# Patient Record
Sex: Male | Born: 1970 | Race: Black or African American | Hispanic: No | Marital: Married | Smoking: Current every day smoker
Health system: Southern US, Community
[De-identification: ages and names within clinical notes are randomized; demographics above are authoritative.]

## PROBLEM LIST (undated history)

## (undated) ENCOUNTER — Emergency Department: Payer: Self-pay | Source: Home / Self Care

## (undated) DIAGNOSIS — R51 Headache: Secondary | ICD-10-CM

## (undated) DIAGNOSIS — R519 Headache, unspecified: Secondary | ICD-10-CM

## (undated) DIAGNOSIS — R42 Dizziness and giddiness: Secondary | ICD-10-CM

## (undated) DIAGNOSIS — G8929 Other chronic pain: Secondary | ICD-10-CM

## (undated) DIAGNOSIS — I10 Essential (primary) hypertension: Secondary | ICD-10-CM

## (undated) DIAGNOSIS — G4733 Obstructive sleep apnea (adult) (pediatric): Secondary | ICD-10-CM

## (undated) DIAGNOSIS — Z8639 Personal history of other endocrine, nutritional and metabolic disease: Secondary | ICD-10-CM

## (undated) HISTORY — PX: TONSILLECTOMY: SUR1361

## (undated) HISTORY — PX: HERNIA REPAIR: SHX51

## (undated) HISTORY — DX: Obstructive sleep apnea (adult) (pediatric): G47.33

## (undated) HISTORY — DX: Headache, unspecified: R51.9

## (undated) HISTORY — DX: Headache: R51

## (undated) HISTORY — DX: Other chronic pain: G89.29

---
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
## 2006-05-17 ENCOUNTER — Other Ambulatory Visit: Payer: Self-pay

## 2006-05-17 ENCOUNTER — Emergency Department: Payer: Self-pay | Admitting: Emergency Medicine

## 2009-01-12 ENCOUNTER — Emergency Department: Payer: Self-pay | Admitting: Emergency Medicine

## 2009-01-24 ENCOUNTER — Emergency Department: Payer: Self-pay | Admitting: Emergency Medicine

## 2010-07-15 ENCOUNTER — Emergency Department: Payer: Self-pay | Admitting: Emergency Medicine

## 2011-05-30 ENCOUNTER — Emergency Department: Payer: Self-pay | Admitting: Emergency Medicine

## 2011-12-17 ENCOUNTER — Other Ambulatory Visit: Payer: Self-pay

## 2011-12-17 ENCOUNTER — Emergency Department (HOSPITAL_COMMUNITY): Payer: Self-pay

## 2011-12-17 ENCOUNTER — Emergency Department (HOSPITAL_COMMUNITY)
Admission: EM | Admit: 2011-12-17 | Discharge: 2011-12-17 | Disposition: A | Discharge status: Home Health | Payer: Self-pay | Attending: Emergency Medicine | Admitting: Emergency Medicine

## 2011-12-17 ENCOUNTER — Encounter (HOSPITAL_COMMUNITY): Payer: Self-pay | Admitting: Emergency Medicine

## 2011-12-17 DIAGNOSIS — R51 Headache: Secondary | ICD-10-CM | POA: Insufficient documentation

## 2011-12-17 DIAGNOSIS — R059 Cough, unspecified: Secondary | ICD-10-CM | POA: Insufficient documentation

## 2011-12-17 DIAGNOSIS — R079 Chest pain, unspecified: Secondary | ICD-10-CM | POA: Insufficient documentation

## 2011-12-17 DIAGNOSIS — R070 Pain in throat: Secondary | ICD-10-CM | POA: Insufficient documentation

## 2011-12-17 DIAGNOSIS — Z79899 Other long term (current) drug therapy: Secondary | ICD-10-CM | POA: Insufficient documentation

## 2011-12-17 DIAGNOSIS — J40 Bronchitis, not specified as acute or chronic: Secondary | ICD-10-CM | POA: Insufficient documentation

## 2011-12-17 DIAGNOSIS — R05 Cough: Secondary | ICD-10-CM | POA: Insufficient documentation

## 2011-12-17 DIAGNOSIS — J329 Chronic sinusitis, unspecified: Secondary | ICD-10-CM | POA: Insufficient documentation

## 2011-12-17 DIAGNOSIS — J3489 Other specified disorders of nose and nasal sinuses: Secondary | ICD-10-CM | POA: Insufficient documentation

## 2011-12-17 LAB — COMPREHENSIVE METABOLIC PANEL
ALT: 30 U/L (ref 0–53)
Alkaline Phosphatase: 75 U/L (ref 39–117)
BUN: 11 mg/dL (ref 6–23)
CO2: 29 mEq/L (ref 19–32)
Chloride: 103 mEq/L (ref 96–112)
GFR calc Af Amer: >90 mL/min (ref >90)
GFR calc non Af Amer: >90 mL/min (ref >90)
Glucose, Bld: 77 mg/dL (ref 70–99)
Potassium: 3.5 mEq/L (ref 3.5–5.1)
Sodium: 142 mEq/L (ref 135–145)
Total Bilirubin: 0.4 mg/dL (ref 0.3–1.2)

## 2011-12-17 LAB — CBC
HCT: 44.7 % (ref 39.0–52.0)
Hemoglobin: 15.3 g/dL (ref 13.0–17.0)
MCHC: 34.2 g/dL (ref 30.0–36.0)
RBC: 4.88 MIL/uL (ref 4.22–5.81)

## 2011-12-17 LAB — RAPID STREP SCREEN (MED CTR MEBANE ONLY): Streptococcus, Group A Screen (Direct): NEGATIVE

## 2011-12-17 MED ORDER — SODIUM CHLORIDE 0.9 % IV BOLUS (SEPSIS)
250.0000 mL | Freq: Once | INTRAVENOUS | Status: AC
  Administered 2011-12-17: 250 mL via INTRAVENOUS

## 2011-12-17 MED ORDER — HYDROMORPHONE HCL PF 1 MG/ML IJ SOLN
1.0000 mg | Freq: Once | INTRAMUSCULAR | Status: AC
  Administered 2011-12-17: 1 mg via INTRAVENOUS
  Filled 2011-12-17: qty 1

## 2011-12-17 MED ORDER — HYDROCODONE-ACETAMINOPHEN 5-325 MG PO TABS: 1.0000 | ORAL_TABLET | Freq: Four times a day (QID) | ORAL | Status: AC | PRN

## 2011-12-17 MED ORDER — AZITHROMYCIN 250 MG PO TABS: 250.0000 mg | ORAL_TABLET | Freq: Every day | ORAL | Status: AC

## 2011-12-17 MED ORDER — ALBUTEROL SULFATE HFA 108 (90 BASE) MCG/ACT IN AERS: 1.0000 | INHALATION_SPRAY | Freq: Four times a day (QID) | RESPIRATORY_TRACT | Status: AC | PRN

## 2011-12-17 MED ORDER — CIPROFLOXACIN HCL 500 MG PO TABS: 500.0000 mg | ORAL_TABLET | Freq: Two times a day (BID) | ORAL | Status: DC

## 2011-12-17 MED ORDER — SODIUM CHLORIDE 0.9 % IV SOLN: INTRAVENOUS | Status: DC

## 2011-12-17 NOTE — ED Notes (Signed)
Onset two weeks ago sore throat, headache and productive cough yellow white sputum. Pain headache 10/10 throbbing. Ax4.

## 2011-12-17 NOTE — Discharge Instructions (Signed)
Sinusitis Sinuses are air pockets within the bones of your face. The growth of bacteria within a sinus leads to infection. The infection prevents the sinuses from draining. This infection is called sinusitis. SYMPTOMS  There will be different areas of pain depending on which sinuses have become infected.  The maxillary sinuses often produce pain beneath the eyes.   Frontal sinusitis may cause pain in the middle of the forehead and above the eyes.  Other problems (symptoms) include:  Toothaches.   Colored, pus-like (purulent) drainage from the nose.   Swelling, warmth, and tenderness over the sinus areas may be signs of infection.  TREATMENT  Sinusitis is most often determined by an exam.X-rays may be taken. If x-rays have been taken, make sure you obtain your results or find out how you are to obtain them. Your caregiver may give you medications (antibiotics). These are medications that will help kill the bacteria causing the infection. You may also be given a medication (decongestant) that helps to reduce sinus swelling.  HOME CARE INSTRUCTIONS   Only take over-the-counter or prescription medicines for pain, discomfort, or fever as directed by your caregiver.   Drink extra fluids. Fluids help thin the mucus so your sinuses can drain more easily.   Applying either moist heat or ice packs to the sinus areas may help relieve discomfort.   Use saline nasal sprays to help moisten your sinuses. The sprays can be found at your local drugstore.  SEEK IMMEDIATE MEDICAL CARE IF:  You have a fever.   You have increasing pain, severe headaches, or toothache.   You have nausea, vomiting, or drowsiness.   You develop unusual swelling around the face or trouble seeing.  MAKE SURE YOU:   Understand these instructions.   Will watch your condition.   Will get help right away if you are not doing well or get worse.  Document Released: 09/22/2005 Document Revised: 09/11/2011 Document Reviewed:  04/21/2007 [facility] Patient Information 2012 [place], [place].  Workup with the finding of pansinusitis take antibiotic as directed resource guide provided below to help you find a primary care provider if symptoms are not improving in a few days followup here or with urgent care. Also most likely have a bronchitis use albuterol inhaler for that no evidence of pneumonia.

## 2011-12-17 NOTE — ED Provider Notes (Signed)
History     CSN: [id number]  Arrival date & time 12/17/11  1054   First MD Initiated Contact with Patient 12/17/11 1506      Chief Complaint  Patient presents with  . Sore Throat    (Consider location/radiation/quality/duration/timing/severity/associated sxs/prior treatment) The history is provided by the patient.   patient is a 41 year old male with multiple complaints today that include chest pain headache sore throat nonproductive cough congestion, correction productive cough of yellow sputum. All symptoms been present for 3 weeks headache is 10 out of 10.  Symptoms consistent with upper respiratory bronchitis-type illness new-onset headache and sore throat. No fever.  History reviewed. No pertinent past medical history.  History reviewed. No pertinent past surgical history.  No family history on file.  History  Substance Use Topics  . Smoking status: Current Everyday Smoker  . Smokeless tobacco: Not on file  . Alcohol Use: No      Review of Systems  Constitutional: Negative for fever and chills.  HENT: Positive for congestion and sore throat. Negative for neck pain.   Respiratory: Positive for cough. Negative for shortness of breath and wheezing.   Cardiovascular: Positive for chest pain.  Gastrointestinal: Negative for nausea, vomiting, abdominal pain and diarrhea.  Genitourinary: Negative for dysuria.  Musculoskeletal: Negative for back pain.  Skin: Negative for rash.  Neurological: Positive for headaches.  Hematological: Does not bruise/bleed easily.    Allergies  Darvocet; Penicillins; and Sulfa antibiotics  Home Medications   Current Outpatient Rx  Name Route Sig Dispense Refill  . CYCLOBENZAPRINE HCL 10 MG PO TABS Oral Take 10 mg by mouth once.     Marland Kitchen DEXTROMETHORPHAN-GUAIFENESIN 10-100 MG/5ML PO LIQD Oral Take 5 mLs by mouth every 4 (four) hours as needed. Cold symptoms    . IBUPROFEN 800 MG PO TABS Oral Take 800 mg by mouth every 8 (eight) hours as  needed. For pain/fever     . TRAMADOL HCL 50 MG PO TABS Oral Take 50 mg by mouth once.     . ALBUTEROL SULFATE HFA 108 (90 BASE) MCG/ACT IN AERS Inhalation Inhale 1-2 puffs into the lungs every 6 (six) hours as needed for wheezing. 1 Inhaler 0  . CIPROFLOXACIN HCL 500 MG PO TABS Oral Take 1 tablet (500 mg total) by mouth 2 (two) times daily. 20 tablet 0  . HYDROCODONE-ACETAMINOPHEN 5-325 MG PO TABS Oral Take 1-2 tablets by mouth every 6 (six) hours as needed for pain. 10 tablet 0    BP 116/63  Pulse 88  Temp(Src) 98.2 F (36.8 C) (Oral)  Resp 16  SpO2 98%  Physical Exam  Nursing note and vitals reviewed. Constitutional: He is oriented to person, place, and time. He appears well-developed and well-nourished. No distress.  HENT:  Head: Normocephalic and atraumatic.  Mouth/Throat: Oropharynx is clear and moist.       Throat is slightly erythematous no swelling no exudate  Eyes: Conjunctivae are normal. Pupils are equal, round, and reactive to light.  Neck: Normal range of motion. Neck supple.  Cardiovascular: Normal rate, regular rhythm and normal heart sounds.   No murmur heard. Pulmonary/Chest: Effort normal and breath sounds normal. No respiratory distress. He has no wheezes. He has no rales. He exhibits no tenderness.  Abdominal: Soft. Bowel sounds are normal. There is no tenderness.  Musculoskeletal: Normal range of motion. He exhibits no edema and no tenderness.  Lymphadenopathy:    He has no cervical adenopathy.  Neurological: He is alert and oriented to person,  place, and time. No cranial nerve deficit. He exhibits normal muscle tone. Coordination normal.  Skin: Skin is warm. No rash noted. No erythema.    ED Course  Procedures (including critical care time)  Labs Reviewed  COMPREHENSIVE METABOLIC PANEL - Abnormal; Notable for the following:    Total Protein 8.5 (*)    All other components within normal limits  CBC  RAPID STREP SCREEN  TROPONIN I   Dg Chest 2  View  12/17/2011  *RADIOLOGY REPORT*  Clinical Data: Sore throat.  SOB and wheezing.  CHEST - 2 VIEW  Comparison: None  Findings: The heart size appears normal.  There is no pleural effusion or pulmonary edema.  Lung volumes are low and there is accentuation of lung markings. No airspace consolidation identified.  There is central airway thickening.  Findings are compatible with lower respiratory tract viral infection or reactive airways disease.  The review of the visualized osseous structures is unremarkable.  IMPRESSION:  1.  No pneumonia. 2.  Central airway thickening compatible with lower respiratory tract viral infection or reactive airways disease.  Original Report Authenticated By: Rosealee Albee, M.D.   Ct Head Wo Contrast  12/17/2011  *RADIOLOGY REPORT*  Clinical Data: Sore throat, headache, productive cough.  CT HEAD WITHOUT CONTRAST  Technique:  Contiguous axial images were obtained from the base of the skull through the vertex without contrast.  Comparison: No priors.  Findings: No acute intracranial abnormalities.  Specifically, no focal mass, mass effect, definitive signs of acute/subacute ischemia, hydrocephalous or abnormal intra or extra-axial fluid collections.  No displaced skull fractures are noted.  Mastoids are well pneumatized bilaterally.  Visualized paranasal sinuses are remarkable for multifocal areas of mucosal thickening and air fluid levels within the maxillary sinuses bilaterally.  IMPRESSION: 1.  Findings consistent with pansinusitis. 2.  No acute intracranial abnormalities.  The appearance of the brain is normal.  Original Report Authenticated By: Florencia Reasons, M.D.    Date: 12/17/2011  Rate: 86  Rhythm: normal sinus rhythm  QRS Axis: right  Intervals: normal  ST/T Wave abnormalities: normal  Conduction Disutrbances:none  Narrative Interpretation:   Old EKG Reviewed: none available  Results for orders placed during the hospital encounter of 12/17/11  CBC       Component Value Range   WBC 8.1  4.0 - 10.5 (K/uL)   RBC 4.88  4.22 - 5.81 (MIL/uL)   Hemoglobin 15.3  13.0 - 17.0 (g/dL)   HCT 96.0  45.4 - 09.8 (%)   MCV 91.6  78.0 - 100.0 (fL)   MCH 31.4  26.0 - 34.0 (pg)   MCHC 34.2  30.0 - 36.0 (g/dL)   RDW 11.9  14.7 - 82.9 (%)   Platelets 295  150 - 400 (K/uL)  COMPREHENSIVE METABOLIC PANEL      Component Value Range   Sodium 142  135 - 145 (mEq/L)   Potassium 3.5  3.5 - 5.1 (mEq/L)   Chloride 103  96 - 112 (mEq/L)   CO2 29  19 - 32 (mEq/L)   Glucose, Bld 77  70 - 99 (mg/dL)   BUN 11  6 - 23 (mg/dL)   Creatinine, Ser 5.62  0.50 - 1.35 (mg/dL)   Calcium 9.7  8.4 - 13.0 (mg/dL)   Total Protein 8.5 (*) 6.0 - 8.3 (g/dL)   Albumin 4.0  3.5 - 5.2 (g/dL)   AST 24  0 - 37 (U/L)   ALT 30  0 - 53 (U/L)  Alkaline Phosphatase 75  39 - 117 (U/L)   Total Bilirubin 0.4  0.3 - 1.2 (mg/dL)   GFR calc non Af Amer >90  >90 (mL/min)   GFR calc Af Amer >90  >90 (mL/min)  RAPID STREP SCREEN      Component Value Range   Streptococcus, Group A Screen (Direct) NEGATIVE  NEGATIVE   TROPONIN I      Component Value Range   Troponin I <0.30  <0.30 (ng/mL)      1. Sinusitis   2. Bronchitis       MDM  Workup without finding of sinusitis pansinusitis most likely to clinically has a bronchitis no evidence of pneumonia no significant leukocytosis. Number chest x-ray negative for pneumonia head CT shows pansinusitis we'll treat with Zithromax for that. Patient given a dose of Cipro here in the emergency department perhaps not the best antibiotic but will be somewhat helpful for complaint of chest pain troponin is negative EKG without acute findings. We'll treat bronchitis with albuterol inhaler has pain medicine to taking erythromycin for the sinusitis and also potential be helpful for the bronchitis 2.        Shelda Jakes, MD 12/17/11 [id number]

## 2012-01-12 ENCOUNTER — Emergency Department: Payer: Self-pay | Admitting: Emergency Medicine

## 2012-06-09 ENCOUNTER — Ambulatory Visit: Payer: Self-pay | Admitting: Internal Medicine

## 2014-03-17 ENCOUNTER — Ambulatory Visit: Payer: Self-pay | Admitting: Internal Medicine

## 2014-03-17 DIAGNOSIS — Z0289 Encounter for other administrative examinations: Secondary | ICD-10-CM

## 2014-04-12 ENCOUNTER — Ambulatory Visit: Payer: Self-pay | Admitting: Internal Medicine

## 2014-05-04 ENCOUNTER — Emergency Department: Payer: Self-pay | Admitting: Emergency Medicine

## 2014-05-04 LAB — URINALYSIS, COMPLETE
BILIRUBIN, UR: NEGATIVE
Bacteria: NONE SEEN
Blood: NEGATIVE
GLUCOSE, UR: NEGATIVE mg/dL (ref 0–75)
Leukocyte Esterase: NEGATIVE
Nitrite: NEGATIVE
PH: 5 (ref 4.5–8.0)
PROTEIN: NEGATIVE
RBC,UR: NONE SEEN /HPF (ref 0–5)
Specific Gravity: 1.028 (ref 1.003–1.030)
Squamous Epithelial: NONE SEEN
WBC UR: NONE SEEN /HPF (ref 0–5)

## 2014-06-09 ENCOUNTER — Emergency Department (HOSPITAL_COMMUNITY): Payer: Self-pay

## 2014-06-09 ENCOUNTER — Inpatient Hospital Stay (HOSPITAL_COMMUNITY)
Admission: EM | Admit: 2014-06-09 | Discharge: 2014-06-10 | DRG: 311 | Disposition: A | Discharge status: Home / Self Care | Payer: Self-pay | Attending: Internal Medicine | Admitting: Internal Medicine

## 2014-06-09 ENCOUNTER — Encounter (HOSPITAL_COMMUNITY): Payer: Self-pay | Admitting: Emergency Medicine

## 2014-06-09 DIAGNOSIS — R1013 Epigastric pain: Secondary | ICD-10-CM

## 2014-06-09 DIAGNOSIS — F17200 Nicotine dependence, unspecified, uncomplicated: Secondary | ICD-10-CM | POA: Diagnosis present

## 2014-06-09 DIAGNOSIS — Z720 Tobacco use: Secondary | ICD-10-CM

## 2014-06-09 DIAGNOSIS — Z8249 Family history of ischemic heart disease and other diseases of the circulatory system: Secondary | ICD-10-CM

## 2014-06-09 DIAGNOSIS — R0789 Other chest pain: Secondary | ICD-10-CM

## 2014-06-09 DIAGNOSIS — I200 Unstable angina: Principal | ICD-10-CM

## 2014-06-09 DIAGNOSIS — R079 Chest pain, unspecified: Secondary | ICD-10-CM

## 2014-06-09 LAB — CBC
HEMATOCRIT: 40.9 % (ref 39.0–52.0)
HEMOGLOBIN: 14.1 g/dL (ref 13.0–17.0)
MCH: 31.3 pg (ref 26.0–34.0)
MCHC: 34.5 g/dL (ref 30.0–36.0)
MCV: 90.9 fL (ref 78.0–100.0)
Platelets: 224 10*3/uL (ref 150–400)
RBC: 4.5 MIL/uL (ref 4.22–5.81)
RDW: 12.5 % (ref 11.5–15.5)
WBC: 4 10*3/uL (ref 4.0–10.5)

## 2014-06-09 LAB — D-DIMER, QUANTITATIVE (NOT AT ARMC)

## 2014-06-09 LAB — HEPATIC FUNCTION PANEL
ALBUMIN: 3.6 g/dL (ref 3.5–5.2)
ALK PHOS: 61 U/L (ref 39–117)
ALT: 31 U/L (ref 0–53)
AST: 29 U/L (ref 0–37)
Bilirubin, Direct: <0.2 mg/dL (ref 0.0–0.3)
TOTAL PROTEIN: 6.7 g/dL (ref 6.0–8.3)
Total Bilirubin: 0.4 mg/dL (ref 0.3–1.2)

## 2014-06-09 LAB — BASIC METABOLIC PANEL
Anion gap: 11 (ref 5–15)
BUN: 11 mg/dL (ref 6–23)
CHLORIDE: 107 meq/L (ref 96–112)
CO2: 26 mEq/L (ref 19–32)
Calcium: 8.6 mg/dL (ref 8.4–10.5)
Creatinine, Ser: 1.01 mg/dL (ref 0.50–1.35)
GFR calc non Af Amer: >90 mL/min (ref >90)
GLUCOSE: 95 mg/dL (ref 70–99)
POTASSIUM: 3.6 meq/L — AB (ref 3.7–5.3)
Sodium: 144 mEq/L (ref 137–147)

## 2014-06-09 LAB — I-STAT TROPONIN, ED: TROPONIN I, POC: 0 ng/mL (ref 0.00–0.08)

## 2014-06-09 LAB — HEMOGLOBIN A1C
Hgb A1c MFr Bld: 5.5 % (ref <5.7)
MEAN PLASMA GLUCOSE: 111 mg/dL (ref <117)

## 2014-06-09 LAB — TROPONIN I
Troponin I: <0.3 ng/mL (ref <0.30)
Troponin I: <0.3 ng/mL (ref <0.30)

## 2014-06-09 LAB — MAGNESIUM: Magnesium: 2.1 mg/dL (ref 1.5–2.5)

## 2014-06-09 LAB — HEPARIN LEVEL (UNFRACTIONATED): Heparin Unfractionated: <0.1 IU/mL — ABNORMAL LOW (ref 0.30–0.70)

## 2014-06-09 MED ORDER — ACETAMINOPHEN 325 MG PO TABS
650.0000 mg | ORAL_TABLET | Freq: Once | ORAL | Status: AC
  Administered 2014-06-09: 650 mg via ORAL
  Filled 2014-06-09: qty 2

## 2014-06-09 MED ORDER — NITROGLYCERIN 0.4 MG SL SUBL: 0.4000 mg | SUBLINGUAL_TABLET | SUBLINGUAL | Status: DC | PRN

## 2014-06-09 MED ORDER — MORPHINE SULFATE 2 MG/ML IJ SOLN
1.0000 mg | INTRAMUSCULAR | Status: DC | PRN
  Administered 2014-06-10: 1 mg via INTRAVENOUS
  Filled 2014-06-09: qty 1

## 2014-06-09 MED ORDER — POTASSIUM CHLORIDE CRYS ER 20 MEQ PO TBCR
20.0000 meq | EXTENDED_RELEASE_TABLET | Freq: Once | ORAL | Status: AC
  Administered 2014-06-09: 20 meq via ORAL
  Filled 2014-06-09: qty 1

## 2014-06-09 MED ORDER — NITROGLYCERIN 2 % TD OINT
1.0000 [in_us] | TOPICAL_OINTMENT | Freq: Four times a day (QID) | TRANSDERMAL | Status: DC
  Administered 2014-06-09: 1 [in_us] via TOPICAL
  Filled 2014-06-09 (×2): qty 1

## 2014-06-09 MED ORDER — HEPARIN BOLUS VIA INFUSION
4000.0000 [IU] | Freq: Once | INTRAVENOUS | Status: AC
  Administered 2014-06-09: 4000 [IU] via INTRAVENOUS
  Filled 2014-06-09: qty 4000

## 2014-06-09 MED ORDER — HEPARIN (PORCINE) IN NACL 100-0.45 UNIT/ML-% IJ SOLN
1300.0000 [IU]/h | INTRAMUSCULAR | Status: DC
  Administered 2014-06-09: 1300 [IU]/h via INTRAVENOUS
  Filled 2014-06-09: qty 250

## 2014-06-09 MED ORDER — KETOROLAC TROMETHAMINE 30 MG/ML IJ SOLN: 30.0000 mg | Freq: Once | INTRAMUSCULAR | Status: DC

## 2014-06-09 MED ORDER — ENOXAPARIN SODIUM 40 MG/0.4ML ~~LOC~~ SOLN
40.0000 mg | SUBCUTANEOUS | Status: DC
  Filled 2014-06-09 (×2): qty 0.4

## 2014-06-09 NOTE — ED Notes (Signed)
Pt c/o severe h/a that just started. Repositioned, explained NTG actions to pt.

## 2014-06-09 NOTE — Consult Note (Signed)
Cardiology consultation  Referring MD:  Dr. Rhunette Croft    PATIENT PROFILE: Roy Jackson is a 43 y.o. male presented to the ER today with complaint of midsternal chest pressure associated with diaphoresis, and dyspnea.   HPI:  Roy Jackson is a 43 y.o. male who denies any previous cardiac history.  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  He does a fair amount of lifting.  Today, he experience midsternal chest tightness and pressure associated with shortness of breath, diaphoresis, and also, which would seem to intensify with deep breathing.  He states the pain lasted in total approximately 5 hours.  He presented to [facility] emergency room for evaluation.  ECG did not show acute ST segment changes, but does most show a small nondiagnostic Q wave in 3, and S wave in lead 1.  A d-dimer has been sent and this is negative.  Previously, the patient denies any exertional component of any chest pain.  He denies exertional shortness of breath.  Cardiac risk factors is notable for very rare history of smoking.  He does not use recreational drugs.  There is a family history for heart disease in his grandfather and 2 of his uncles who had suffered myocardial infarctions.  History reviewed. No pertinent past medical history.  Past Surgical History  Procedure Laterality Date  . Tonsillectomy    . Hernia repair      Allergies  Allergen Reactions  . Darvocet [Propoxyphene N-Acetaminophen] Other (See Comments)    unknown  . Penicillins Swelling  . Sulfa Antibiotics Other (See Comments)    unkown    Current Facility-Administered Medications  Medication Dose Route Frequency Provider Last Rate Last Dose  . heparin ADULT infusion 100 units/mL (25000 units/250 mL)  1,300 Units/hr Intravenous Continuous Ankit Nanavati, MD 13 mL/hr at 06/09/14 1358 1,300 Units/hr at 06/09/14 1358  . nitroGLYCERIN (NITROGLYN) 2 % ointment 1 inch  1 inch Topical 4 times per day Derwood Kaplan, MD   1  inch at 06/09/14 1452   Current Outpatient Prescriptions  Medication Sig Dispense Refill  . Multiple Vitamin (MULTIVITAMIN WITH MINERALS) TABS tablet Take 1 tablet by mouth daily.       Socially, he is married and has one child.  Family history is notable in that his father died when the patient was 28 months old from a car accident.  His mother is alive at 74 years.  He has 3 sisters. ROS General: Negative; No fevers, chills, or night sweats HEENT: Negative; No changes in vision or hearing, sinus congestion, difficulty swallowing Pulmonary: Negative; No cough, wheezing, shortness of breath, hemoptysis Cardiovascular:  See HPI;  GI: Negative; No nausea, vomiting, diarrhea, or abdominal pain GU: Negative; No dysuria, hematuria, or difficulty voiding Musculoskeletal: Negative; no myalgias, joint pain, or weakness Hematologic/Oncologic: Negative; no easy bruising, bleeding Endocrine: Negative; no heat/cold intolerance; no diabetes Neuro: Negative; no changes in balance, headaches Skin: Negative; No rashes or skin lesions Psychiatric: Negative; No behavioral problems, depression Sleep: Negative; No daytime sleepiness, hypersomnolence, bruxism, restless legs, hypnogagnic hallucinations Other comprehensive 14 point system review is negative   Physical Exam BP 124/71  Pulse 69  Temp(Src) 98.1 F (36.7 C) (Oral)  Resp 16  Ht  (1.702 m)  Wt 230 lb (104.327 kg)  BMI 36.01 kg/m2  SpO2 96% General: Alert, oriented, no distress.  Muscular gentleman. Skin: normal turgor, no rashes, warm and dry HEENT: Normocephalic, atraumatic. Pupils equal round and reactive to light; sclera  anicteric; extraocular muscles intact;  Nose without nasal septal hypertrophy Mouth/Parynx benign; Mallinpatti scale 3 Neck: No JVD, no carotid bruits; normal carotid upstroke Lungs: clear to ausculatation and percussion; no wheezing or rales Chest wall: Definite tenderness to palpation.  The patient would  grimace when pressure was applied to his anterior chest. Heart: PMI not displaced, RRR, s1 s2 normal, 1/6 systolic murmur, no diastolic murmur, no rubs, gallops, thrills, or heaves Abdomen: soft, nontender; no hepatosplenomehaly, BS+; abdominal aorta nontender and not dilated by palpation. Back: no CVA tenderness Pulses 2+ Musculoskeletal: full range of motion, normal strength, no joint deformities Extremities: no clubbing cyanosis or edema, Homan's sign negative  Neurologic: grossly nonfocal; Cranial nerves grossly wnl Psychologic: Normal mood and affect   ECG (independently read by me): Normal sinus rhythm at 67.  Small nondiagnostic Q wave in 3, S wave in lead 1.  No significant ST changes.  LABS:  BMET    Component Value Date/Time   NA 144 06/09/2014 1132   K 3.6* 06/09/2014 1132   CL 107 06/09/2014 1132   CO2 26 06/09/2014 1132   GLUCOSE 95 06/09/2014 1132   BUN 11 06/09/2014 1132   CREATININE 1.01 06/09/2014 1132   CALCIUM 8.6 06/09/2014 1132   GFRNONAA >90 06/09/2014 1132   GFRAA >90 06/09/2014 1132     Hepatic Function Panel     Component Value Date/Time   PROT 8.5* 12/17/2011 1605   ALBUMIN 4.0 12/17/2011 1605   AST 24 12/17/2011 1605   ALT 30 12/17/2011 1605   ALKPHOS 75 12/17/2011 1605   BILITOT 0.4 12/17/2011 1605     CBC    Component Value Date/Time   WBC 4.0 06/09/2014 1132   RBC 4.50 06/09/2014 1132   HGB 14.1 06/09/2014 1132   HCT 40.9 06/09/2014 1132   PLT 224 06/09/2014 1132   MCV 90.9 06/09/2014 1132   MCH 31.3 06/09/2014 1132   MCHC 34.5 06/09/2014 1132   RDW 12.5 06/09/2014 1132   Troponin (Point of Care Test)  Recent Labs  06/09/14 1157  TROPIPOC 0.00    BNP No results found for this basename: probnp    Lipid Panel  No results found for this basename: chol, trig, hdl, cholhdl, vldl, ldlcalc      RADIOLOGY: Dg Chest 2 View  06/09/2014   CLINICAL DATA:  Chest pain  EXAM: CHEST  2 VIEW  COMPARISON:  Chest radiograph 12/17/2011  FINDINGS: Stable cardiac and  mediastinal contours. No consolidative pulmonary opacities. No pleural effusion or pneumothorax. Regional skeleton is unremarkable.  IMPRESSION: No acute cardiopulmonary process.   Electronically Signed   By: Annia Belt M.D.   On: 06/09/2014 12:43     ASSESSMENT AND PLAN:  1.  Chest pain: The patient has significant tenderness to palpation over the anterior chest wall, suggesting a musculoskeletal etiology to his chest discomfort.  He has been started on nitroglycerin paste, as well as IV heparin in the emergency room.  His ECG does not show acute ST segment changes.  He does have some pleuritic component to his discomfort.  His d-dimer is negative arguing against potential pulmonary embolism.  Doubt ischemic chest pain, but the patient did have chest pain, lasting approximately 5 hours with significant intensity.  Initial troponin is 0.  Agree with keeping patient overnight, at least for observation and serial cardiac enzymes and f/u ECG. May be worthwhile to consider IV Toradol to see if this improves his chest wall discomfort for which he is exquisitely  tender.  Noninvasive cardiac screening with 2-D echo Doppler and nuclear stress testing can be performed for cardiac evaluation and his laboratory workup is negative, this can be done as an outpatient.                                                                                                                                                                                                                                                                                                                    Lennette Bihari, MD, [facility] 06/09/2014 4:04 PM

## 2014-06-09 NOTE — ED Notes (Signed)
Cardiology consult at bedside.

## 2014-06-09 NOTE — ED Provider Notes (Addendum)
CSN: [id number]     Arrival date & time 06/09/14  1111 History   First MD Initiated Contact with Patient 06/09/14 1137     Chief Complaint  Patient presents with  . Chest Pain     (Consider location/radiation/quality/duration/timing/severity/associated sxs/prior Treatment) HPI Comments: Pt comes in with cc of chest pain while at work. Pain started mdisternal area, radiating to the left side and left arm. Pt describes the pain as squeezing pain, still feels sore in that area. + nausea, sweats and had some dib.  PT has no hx of similar pain. No medical problems, occasional smoker, and has family hx of CAD (66s and 40s - uncle and grand father).   Patient is a 43 y.o. male presenting with chest pain. The history is provided by the patient.  Chest Pain Associated symptoms: diaphoresis, nausea, numbness and shortness of breath   Associated symptoms: no abdominal pain and no cough     No past medical history on file. No past surgical history on file. No family history on file. History  Substance Use Topics  . Smoking status: Current Every Day Smoker  . Smokeless tobacco: Not on file  . Alcohol Use: No    Review of Systems  Constitutional: Positive for diaphoresis. Negative for activity change and appetite change.  Respiratory: Positive for shortness of breath. Negative for cough.   Cardiovascular: Positive for chest pain.  Gastrointestinal: Positive for nausea. Negative for abdominal pain.  Genitourinary: Negative for dysuria.  Neurological: Positive for numbness.      Allergies  Darvocet; Penicillins; and Sulfa antibiotics  Home Medications   Prior to Admission medications   Medication Sig Start Date End Date Taking? Authorizing Provider  Multiple Vitamin (MULTIVITAMIN WITH MINERALS) TABS tablet Take 1 tablet by mouth daily.   Yes Historical Provider, MD   BP 123/67  Pulse 48  Temp(Src) 97.9 F (36.6 C) (Oral)  Resp 14  Ht  (1.702 m)  Wt 230 lb (104.327 kg)  BMI  36.01 kg/m2  SpO2 99% Physical Exam  Nursing note and vitals reviewed. Constitutional: He is oriented to person, place, and time. He appears well-developed.  HENT:  Head: Normocephalic and atraumatic.  Eyes: Conjunctivae and EOM are normal. Pupils are equal, round, and reactive to light.  Neck: Normal range of motion. Neck supple.  Cardiovascular: Normal rate, regular rhythm and intact distal pulses.   Pulmonary/Chest: Effort normal and breath sounds normal. He exhibits tenderness.  Abdominal: Soft. Bowel sounds are normal. He exhibits no distension. There is no tenderness. There is no rebound and no guarding.  Neurological: He is alert and oriented to person, place, and time.  Skin: Skin is warm.    ED Course  Procedures (including critical care time) Labs Review Labs Reviewed  BASIC METABOLIC PANEL - Abnormal; Notable for the following:    Potassium 3.6 (*)    All other components within normal limits  CBC  D-DIMER, QUANTITATIVE  HEPARIN LEVEL (UNFRACTIONATED)  TROPONIN I  I-STAT TROPOININ, ED    Imaging Review Dg Chest 2 View  06/09/2014   CLINICAL DATA:  Chest pain  EXAM: CHEST  2 VIEW  COMPARISON:  Chest radiograph 12/17/2011  FINDINGS: Stable cardiac and mediastinal contours. No consolidative pulmonary opacities. No pleural effusion or pneumothorax. Regional skeleton is unremarkable.  IMPRESSION: No acute cardiopulmonary process.   Electronically Signed   By: Annia Belt M.D.   On: 06/09/2014 12:43     EKG Interpretation   Date/Time:  Friday June 09 2014 11:18:42 EDT Ventricular Rate:  67 PR Interval:  129 QRS Duration: 86 QT Interval:  428 QTC Calculation: 452 R Axis:   95 Text Interpretation:  Sinus rhythm Borderline right axis deviation t wave  inversion in v1 is new Confirmed by Rhunette Croft, MD, Janey Genta [phone]) on  06/09/2014 12:39:46 PM      MDM   Final diagnoses:  Chest pain of uncertain etiology  Unstable angina    Pt with chest pain, midsternal,  radiating to the left side, including arm. + diaphoresis, nausea, dib. Pt 's sx have responded to nitro - went from 9/10 to 1/10, pain still present slightly. Pt has no CAD risk factors. EKG shows no acute ST changes. Pt does have s1q3t3 pattern, so we will get a screening dimer. Working dx currently is acs vs, PE. ASA full dose taken at work, prior to ER arrival.  Pt has no risk factors for PE, or for dissection. WELLS score is 0, PERC neg.  Derwood Kaplan, MD 06/09/14 1305  2:51 PM Dimer is neg. CP free, Will admit as unassigned. Spoke with PA-C from Cards, pt ok to be admitted to hospitalist for nstemi, with them on consult.  Derwood Kaplan, MD 06/09/14 1451

## 2014-06-09 NOTE — ED Notes (Signed)
To ED via [facility] with c/o chest pain while at work-- hurts to move, hurts to breath-- pain on palpation-- received NTG x 2, ASA  per EMS with some relief of pain--

## 2014-06-09 NOTE — H&P (Signed)
Date: 06/09/2014               Patient Name:  Roy Jackson MRN: [id number]  DOB: 10-02-1971 Age / Sex: 43 y.o., male   PCP: Lorre Munroe, NP         Medical Service: Internal Medicine Teaching Service         Attending Physician: Dr. Derwood Kaplan, MD    First Contact: Dr. Isabella Jackson Pager: [phone]  Second Contact: Dr. Shirlee Latch Pager: [phone]       After Hours (After 5p/  First Contact Pager: [phone]  weekends / holidays): Second Contact Pager: [phone]   Chief Complaint: chest pain  History of Present Illness: Mr. Meiner is a 43 year old man with no significant past medical history presenting with chest pain x 1 day. He was standing around at work when he suddenly became diaphoretic. He had squeezing pain located in the mid sternal region and radiates to the left upper arm. The pain lasted for 5-6 hours and was 8-9/10 in severity at worst. He notes no exacerbating factors. It was relieved by nitroglycerin he received in the ED. He has never had pain like this in the past. Presently he feels chest soreness that is 1-2/10 in severity. He had associated lightheadedness, shortness of breath, nausea, and nonbloody vomiting x3-4. He denies fevers, chills, cough, palpitations, exertional SOB, exertional chest pain, reflux, abdominal pain, diarrhea, dysuria, hematuria, legg swelling. He smokes half a pack a week of cigarettes x 1 year. He has a maternal grandfather and two maternal uncles that had MI in their 4s-50s. 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  Meds: Current Facility-Administered Medications  Medication Dose Route Frequency Provider Last Rate Last Dose  . heparin ADULT infusion 100 units/mL (25000 units/250 mL)  1,300 Units/hr Intravenous Continuous Ankit Nanavati, MD 13 mL/hr at 06/09/14 1358 1,300 Units/hr at 06/09/14 1358  . nitroGLYCERIN (NITROGLYN) 2 % ointment 1 inch  1 inch Topical 4 times per day Derwood Kaplan, MD   1 inch  at 06/09/14 1452   Current Outpatient Prescriptions  Medication Sig Dispense Refill  . Multiple Vitamin (MULTIVITAMIN WITH MINERALS) TABS tablet Take 1 tablet by mouth daily.        Allergies: Allergies as of 06/09/2014 - Review Complete 06/09/2014  Allergen Reaction Noted  . Darvocet [propoxyphene n-acetaminophen] Other (See Comments) 12/17/2011  . Penicillins Swelling 12/17/2011  . Sulfa antibiotics Other (See Comments) 12/17/2011   History reviewed. No pertinent past medical history. Past Surgical History  Procedure Laterality Date  . Tonsillectomy    . Hernia repair    (R inguinal 10-12 years ago)  Family History Maternal Uncle x2 with CAD, MI in 30s Maternal Grandfather with CAD, MI in 81s Maternal Grandmother with HTN  History   Social History  . Marital Status: Married    Spouse Name: N/A    Number of Children: N/A  . Years of Education: N/A   Occupational History  . Not on file.   Social History Main Topics  . Smoking status: Current Some Day Smoker  . Smokeless tobacco: Not on file  . Alcohol Use: No  . Drug Use: No  . Sexual Activity: Not on file   Other Topics Concern  . Not on file   Social History Narrative  . 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  Review of Systems: Constitutional: no fevers/chills Eyes: no vision changes Ears, nose, mouth, throat, and face: no cough Respiratory: +shortness of breath Cardiovascular: +chest pain Gastrointestinal: +nausea/vomiting, no abdominal pain, no constipation, no diarrhea Genitourinary: no dysuria, no hematuria Integument: no rash Hematologic/lymphatic: no bleeding/bruising, no edema Musculoskeletal: no arthralgias, no myalgias Neurological: no paresthesias, no weakness  Physical Exam: Blood pressure 124/71, pulse 70, temperature 98.1 F (36.7 C), temperature source Oral, resp.  rate 21, height  (1.702 m), weight 230 lb (104.327 kg), SpO2 95.00%.  General Apperance: NAD Head: Normocephalic, atraumatic Eyes: PERRL, EOMI, anicteric sclera Ears: Normal external ear canal Nose: Nares normal, septum midline, mucosa normal Throat: Lips, mucosa and tongue normal  Neck: Supple, trachea midline Back: No tenderness or bony abnormality  Lungs: Clear to auscultation bilaterally. No wheezes, rhonchi or rales. Breathing comfortably Chest Wall: Nontender, no deformity Heart: Regular rate and rhythm, no murmur/rub/gallop Abdomen: Soft, nontender, nondistended, no rebound/guarding Extremities: Normal, atraumatic, warm and well perfused, no edema Pulses: 2+ throughout Skin: No rashes or lesions Neurologic: Alert and oriented x 3. CNII-XII intact. Normal strength and sensation  Lab results: Basic Metabolic Panel:  Recent Labs  16/10/96 1132  NA 144  K 3.6*  CL 107  CO2 26  GLUCOSE 95  BUN 11  CREATININE 1.01  CALCIUM 8.6   Liver Function Tests:  Recent Labs  06/09/14 1132  AST 29  ALT 31  ALKPHOS 61  BILITOT 0.4  PROT 6.7  ALBUMIN 3.6   CBC:  Recent Labs  06/09/14 1132  WBC 4.0  HGB 14.1  HCT 40.9  MCV 90.9  PLT 224   Cardiac Enzymes:  Recent Labs  06/09/14 1443  TROPONINI <0.30   D-Dimer:  Recent Labs  06/09/14 1132  DDIMER <0.27   Urine Drug Screen: Drugs of Abuse  No results found for this basename: labopia,  cocainscrnur,  labbenz,  amphetmu,  thcu,  labbarb   Imaging results:  Dg Chest 2 View  06/09/2014   CLINICAL DATA:  Chest pain  EXAM: CHEST  2 VIEW  COMPARISON:  Chest radiograph 12/17/2011  FINDINGS: Stable cardiac and mediastinal contours. No consolidative pulmonary opacities. No pleural effusion or pneumothorax. Regional skeleton is unremarkable.  IMPRESSION: No acute cardiopulmonary process.   Electronically Signed   By: Annia Belt M.D.   On: 06/09/2014 12:43    Other results: EKG: Normal sinus rhythm. T wave in  lead III present in previous EKG. T wave inverted in V1 new compared to previous EKG. Otherwise EKG unchanged from previous.  Assessment & Plan by Problem: Active Problems:   Unstable angina  Chest pain: differential includes acute coronary syndrome, PE, infectious aortic dissection, pneumothorax, GERD, MSK. Pain is not reproducible on palpation making MSK etiology less likely. Denies reflux symptoms making GERD less likely. CXR with no acute cardiopulmonary process making pneumothorax or aortic dissection less likely. Wells score 0 and Geneva score 3 (heart rate 75-94) making him low risk for PE. He had S1Q3T3 pattern on his EKG but d-dimer is negative. Initial troponin negative and EKG without acute ischemic changes. His TIMI score is 0 presently. Cardiology consulted by ED. -UDS pending -repeat EKG in AM -trend troponins -lipid panel -hemoglobin A1c  -morphine  Q2hr prn pain -nitrostat SL 0.4mg  Q61min prn chest pain -telemetry -per cardiology, patient can be evaluated as outpatient with 2D echo and nuclear stress test  FEN/GI: heart healthy  DVT ppx: lovenox  daily, SCDs  Dispo:  Disposition is deferred at this time, awaiting improvement of current medical problems. Anticipated discharge in approximately 1 day(s).   The patient does not have a current PCP Lorre Munroe, NP) and does not need an [facility] hospital follow-up appointment after discharge.  The patient does not know have transportation limitations that hinder transportation to clinic appointments.  Signed: Griffin Basil, MD 06/09/2014, 3:34 PM

## 2014-06-09 NOTE — Progress Notes (Signed)
ANTICOAGULATION CONSULT NOTE - Initial Consult  Pharmacy Consult for heparin Indication: chest pain/ACS  Allergies  Allergen Reactions  . Darvocet [Propoxyphene N-Acetaminophen] Other (See Comments)    unknown  . Penicillins Swelling  . Sulfa Antibiotics Other (See Comments)    unkown    Patient Measurements:    Dosing Weight:  83.5 kg  Vital Signs: Temp: 97.9 F (36.6 C) (09/04 1122) Temp src: Oral (09/04 1122) BP: 117/70 mmHg (09/04 1235) Pulse Rate: 58 (09/04 1235)  Labs:  Recent Labs  06/09/14 1132  HGB 14.1  HCT 40.9  PLT 224    CrCl is unknown because there is no height on file for the current visit.   Medical History: No past medical history on file.  Medications:  See medication history  Assessment: 43 yo man c/o chest pain while at work.  He has a family history of CAD.  His troponin is negative, baseline CBC wnl.  CXR unremarkable. Goal of Therapy:  Heparin level 0.3-0.7 units/ml Monitor platelets by anticoagulation protocol: Yes   Plan:  Heparin bolus 4000 units and drip at 1300 units/hr Check heparin level 6 hours after start. Daily HL and CBC while on heparin  Thanks for allowing pharmacy to be a part of this patient's care.  Talbert Cage, PharmD Clinical Pharmacist, [phone] 06/09/2014,12:47 PM

## 2014-06-10 DIAGNOSIS — Z720 Tobacco use: Secondary | ICD-10-CM | POA: Diagnosis present

## 2014-06-10 LAB — RAPID URINE DRUG SCREEN, HOSP PERFORMED
Amphetamines: NOT DETECTED
BENZODIAZEPINES: NOT DETECTED
Barbiturates: NOT DETECTED
COCAINE: NOT DETECTED
Opiates: POSITIVE — AB
Tetrahydrocannabinol: NOT DETECTED

## 2014-06-10 LAB — LIPID PANEL
Cholesterol: 166 mg/dL (ref 0–200)
HDL: 43 mg/dL (ref >39)
LDL CALC: 105 mg/dL — AB (ref 0–99)
Total CHOL/HDL Ratio: 3.9 RATIO
Triglycerides: 90 mg/dL (ref <150)
VLDL: 18 mg/dL (ref 0–40)

## 2014-06-10 LAB — CBC
HEMATOCRIT: 39.4 % (ref 39.0–52.0)
HEMOGLOBIN: 13.4 g/dL (ref 13.0–17.0)
MCH: 32.1 pg (ref 26.0–34.0)
MCHC: 34 g/dL (ref 30.0–36.0)
MCV: 94.5 fL (ref 78.0–100.0)
Platelets: 196 10*3/uL (ref 150–400)
RBC: 4.17 MIL/uL — AB (ref 4.22–5.81)
RDW: 12.9 % (ref 11.5–15.5)
WBC: 4.1 10*3/uL (ref 4.0–10.5)

## 2014-06-10 LAB — LIPASE, BLOOD: Lipase: 32 U/L (ref 11–59)

## 2014-06-10 LAB — OCCULT BLOOD X 1 CARD TO LAB, STOOL: Fecal Occult Bld: NEGATIVE

## 2014-06-10 LAB — TROPONIN I: Troponin I: <0.3 ng/mL (ref <0.30)

## 2014-06-10 MED ORDER — PANTOPRAZOLE SODIUM 40 MG PO TBEC: 40.0000 mg | DELAYED_RELEASE_TABLET | Freq: Every day | ORAL | Status: DC

## 2014-06-10 MED ORDER — PANTOPRAZOLE SODIUM 40 MG PO TBEC
40.0000 mg | DELAYED_RELEASE_TABLET | Freq: Every day | ORAL | Status: DC
  Administered 2014-06-10: 40 mg via ORAL
  Filled 2014-06-10: qty 1

## 2014-06-10 MED ORDER — ENOXAPARIN SODIUM 40 MG/0.4ML ~~LOC~~ SOLN
40.0000 mg | SUBCUTANEOUS | Status: DC
  Administered 2014-06-10: 40 mg via SUBCUTANEOUS
  Filled 2014-06-10: qty 0.4

## 2014-06-10 NOTE — Progress Notes (Signed)
Subjective: No acute events overnight. He is doing well. He reports some headache. He denies chest pain, shortness of breath, nausea, vomiting, abdominal pain. He reports he has had heartburn and acid reflux in the past that is relieved by Tums.  Objective: Vital signs in last 24 hours: Filed Vitals:   06/09/14 1641 06/09/14 1730 06/09/14 2155 06/10/14 0456  BP:  129/71 116/55 113/70  Pulse:  64 63 64  Temp: 98.2 F (36.8 C) 98.9 F (37.2 C) 98 F (36.7 C) 98.7 F (37.1 C)  TempSrc: Oral Oral Oral Oral  Resp:  Height:   (1.702 m)    Weight:  217 lb 13 oz (98.8 kg)  217 lb 9.5 oz (98.7 kg)  SpO2:  100% 99% 100%   Weight change:  No intake or output data in the 24 hours ending 06/10/14 0810 General Apperance: NAD  Head: Normocephalic, atraumatic  Eyes: PERRL, EOMI, anicteric sclera  Ears: Normal external ear canal  Nose: Nares normal, septum midline, mucosa normal  Throat: Lips, mucosa and tongue normal  Neck: Supple, trachea midline  Back: No tenderness or bony abnormality  Lungs: Clear to auscultation bilaterally. No wheezes, rhonchi or rales. Breathing comfortably  Chest Wall: Nontender, no deformity  Heart: Regular rate and rhythm, no murmur/rub/gallop  Abdomen: Soft, nontender, nondistended, no rebound/guarding  Extremities: Normal, atraumatic, warm and well perfused, no edema  Pulses: 2+ throughout  Skin: No rashes or lesions  Neurologic: Alert and oriented x 3. CNII-XII intact. Normal strength and sensation  Lab Results: Basic Metabolic Panel:  Recent Labs Lab 06/09/14 1132  NA 144  K 3.6*  CL 107  CO2 26  GLUCOSE 95  BUN 11  CREATININE 1.01  CALCIUM 8.6  MG 2.1   Liver Function Tests:  Recent Labs Lab 06/09/14 1132  AST 29  ALT 31  ALKPHOS 61  BILITOT 0.4  PROT 6.7  ALBUMIN 3.6   Lipase     Component Value Date/Time   LIPASE 32 06/10/2014 0343   CBC:  Recent Labs Lab 06/09/14 1132 06/10/14 0343  WBC 4.0 4.1  HGB  14.1 13.4  HCT 40.9 39.4  MCV 90.9 94.5  PLT 224 196   Cardiac Enzymes:  Recent Labs Lab 06/09/14 1443 06/09/14 2017 06/10/14 0343  TROPONINI <0.30 <0.30 <0.30   D-Dimer:  Recent Labs Lab 06/09/14 1132  DDIMER <0.27   Hemoglobin A1C:  Recent Labs Lab 06/09/14 1132  HGBA1C 5.5   Fasting Lipid Panel:  Recent Labs Lab 06/10/14 0343  CHOL 166  HDL 43  LDLCALC 105*  TRIG 90  CHOLHDL 3.9   Studies/Results: Dg Chest 2 View  06/09/2014   CLINICAL DATA:  Chest pain  EXAM: CHEST  2 VIEW  COMPARISON:  Chest radiograph 12/17/2011  FINDINGS: Stable cardiac and mediastinal contours. No consolidative pulmonary opacities. No pleural effusion or pneumothorax. Regional skeleton is unremarkable.  IMPRESSION: No acute cardiopulmonary process.   Electronically Signed   By: Annia Belt M.D.   On: 06/09/2014 12:43   Medications: I have reviewed the patient's current medications. Scheduled Meds: . enoxaparin (LOVENOX) injection  40 mg Subcutaneous Q24H  . ketorolac  30 mg Intravenous Once   Continuous Infusions:  PRN Meds:.morphine injection, nitroGLYCERIN Assessment/Plan: Principal Problem:   Chest pain Active Problems:   Unstable angina  Chest pain: CXR with no acute cardiopulmonary process making pneumothorax or aortic dissection less likely. S1Q3T3 pattern on his initial EKG but d-dimer is negative. EKG without  acute ischemic changes. Troponin negative x 3. Lipase wnl. Lipid panel with normal total cholesterol, triglycerides, and HDL. Hemoglobin A1c 5.5. Cardiology consulted by ED.  -UDS pending  -FOBT -H. Pylori antibody -start PPI  daily -repeat EKG pending -morphine  Q2hr prn pain  -nitrostat SL 0.4mg  Q37min prn chest pain  -telemetry   -per cardiology, patient can be evaluated as outpatient with 2D echo and nuclear stress test   FEN/GI: heart healthy   DVT ppx: lovenox  daily, SCDs  Dispo: likely home today  The patient does not have a current PCP  Lorre Munroe, NP) and does not need an [facility] hospital follow-up appointment after discharge.  The patient does not know have transportation limitations that hinder transportation to clinic appointments.  .Services Needed at time of discharge: Y = Yes, Blank = No PT:   OT:   RN:   Equipment:   Other:     LOS: 1 day   Griffin Basil, MD 06/10/2014, 8:10 AM

## 2014-06-10 NOTE — H&P (Signed)
INTERNAL MEDICINE TEACHING ATTENDING NOTE  Day 1 of stay  Patient name: Roy Jackson  MRN: [id number] Date of birth: [birth year]   Key clinical points and exam                                                          43 y.o.old male african american, no significant past medical history and on no medications, admitted with sudden onset chest pain (epigastric/substernal) with nausea, vomiting, diaphoresis and radiation to left upper arm, which lasted 5-6 hours and was relieved by nitroglycerin. The patient reports 1-2 weeks of heart burn, and sometimes waking up with sour metallic taste in his mouth. He does not take over the counter pain medications regularly. He smokes occassionally ("1 pack lasts a week"). He denies any nausea after eating. He has never had such pain in the past. He denies any blood in stool, or any change in the color of his stool. He denies any abdominal pain, rash or new medication intake. He works at a [employer] with another person. He has not had any nausea or vomiting since admission, and just feels sore right now. He reports a headache.   On exam, his vitals are stable. He is alert and oriented. He does have some substernal tenderness to palpation, and some epigastric and LUQ tenderness to pressure. His oropharynx is moist, midline tongue with no overt pathology. He has no cervical lymphadenopathy. His heart has regular rate and rhythm with no murmurs. He has no pedal edema.   He has a son 79 years old, and a daughter 69 months old.  Assessment and Plan                                                                      Chest/Epigastric pain - I agree with the plan outlined by Dr Isabella Bowens and Dr Shirlee Latch with the following additions:  - Add lipase to work up. - Add H Pylori antibody testing. - Acute MI work up negative and has been seen by cardiology.  - Liver enzymes not elevated, no RUQ tenderness thus doubt cholecystitis or  choledocholithiasis.  - Gradual decline in HgB over the years. Do an FOBT.  - Start on PPI now and discharge on it.  - Set up a follow up in [facility] clinic if the patient agreeable where he might be able to get an orange card.  - Get a GI referral set up for a possible work up if symptoms don't resolve post discharge.  I have seen and evaluated this patient and discussed it with my IM resident team.  Please see the rest of the plan per resident note from today.   Alayja Armas 06/10/2014, 8:49 AM.

## 2014-06-10 NOTE — Discharge Instructions (Signed)
Chest Pain (Nonspecific) °It is often hard to give a specific diagnosis for the cause of chest pain. There is always a chance that your pain could be related to something serious, such as a heart attack or a blood clot in the lungs. You need to follow up with your health care provider for further evaluation. °CAUSES  °· Heartburn. °· Pneumonia or bronchitis. °· Anxiety or stress. °· Inflammation around your heart (pericarditis) or lung (pleuritis or pleurisy). °· A blood clot in the lung. °· A collapsed lung (pneumothorax). It can develop suddenly on its own (spontaneous pneumothorax) or from trauma to the chest. °· Shingles infection (herpes zoster virus). °The chest wall is composed of bones, muscles, and cartilage. Any of these can be the source of the pain. °· The bones can be bruised by injury. °· The muscles or cartilage can be strained by coughing or overwork. °· The cartilage can be affected by inflammation and become sore (costochondritis). °DIAGNOSIS  °Lab tests or other studies may be needed to find the cause of your pain. Your health care provider may have you take a test called an ambulatory electrocardiogram (ECG). An ECG records your heartbeat patterns over a 24-hour period. You may also have other tests, such as: °· Transthoracic echocardiogram (TTE). During echocardiography, sound waves are used to evaluate how blood flows through your heart. °· Transesophageal echocardiogram (TEE). °· Cardiac monitoring. This allows your health care provider to monitor your heart rate and rhythm in real time. °· Holter monitor. This is a portable device that records your heartbeat and can help diagnose heart arrhythmias. It allows your health care provider to track your heart activity for several days, if needed. °· Stress tests by exercise or by giving medicine that makes the heart beat faster. °TREATMENT  °· Treatment depends on what may be causing your chest pain. Treatment may include: °¨ Acid blockers for  heartburn. °¨ Anti-inflammatory medicine. °¨ Pain medicine for inflammatory conditions. °¨ Antibiotics if an infection is present. °· You may be advised to change lifestyle habits. This includes stopping smoking and avoiding alcohol, caffeine, and chocolate. °· You may be advised to keep your head raised (elevated) when sleeping. This reduces the chance of acid going backward from your stomach into your esophagus. °Most of the time, nonspecific chest pain will improve within 2-3 days with rest and mild pain medicine.  °HOME CARE INSTRUCTIONS  °· If antibiotics were prescribed, take them as directed. Finish them even if you start to feel better. °· For the next few days, avoid physical activities that bring on chest pain. Continue physical activities as directed. °· Do not use any tobacco products, including cigarettes, chewing tobacco, or electronic cigarettes. °· Avoid drinking alcohol. °· Only take medicine as directed by your health care provider. °· Follow your health care provider's suggestions for further testing if your chest pain does not go away. °· Keep any follow-up appointments you made. If you do not go to an appointment, you could develop lasting (chronic) problems with pain. If there is any problem keeping an appointment, call to reschedule. °SEEK MEDICAL CARE IF:  °· Your chest pain does not go away, even after treatment. °· You have a rash with blisters on your chest. °· You have a fever. °SEEK IMMEDIATE MEDICAL CARE IF:  °· You have increased chest pain or pain that spreads to your arm, neck, jaw, back, or abdomen. °· You have shortness of breath. °· You have an increasing cough, or you cough   up blood. °· You have severe back or abdominal pain. °· You feel nauseous or vomit. °· You have severe weakness. °· You faint. °· You have chills. °This is an emergency. Do not wait to see if the pain will go away. Get medical help at once. Call your local emergency services (911 in [place] Do not drive  yourself to the hospital. °MAKE SURE YOU:  °· Understand these instructions. °· Will watch your condition. °· Will get help right away if you are not doing well or get worse. °Document Released: 07/02/2005 Document Revised: 09/27/2013 Document Reviewed: 04/27/2008 °ExitCare® Patient Information ©2015 [employer]. This information is not intended to replace advice given to you by your health care provider. Make sure you discuss any questions you have with your health care provider. ° ° °Emergency Department Resource Guide °1) Find a Doctor and Pay Out of Pocket °Although you won't have to find out who is covered by your insurance plan, it is a good idea to ask around and get recommendations. You will then need to call the office and see if the doctor you have chosen will accept you as a new patient and what types of options they offer for patients who are self-pay. Some doctors offer discounts or will set up payment plans for their patients who do not have insurance, but you will need to ask so you aren't surprised when you get to your appointment. ° °2) Contact Your Local Health Department °Not all health departments have doctors that can see patients for sick visits, but many do, so it is worth a call to see if yours does. If you don't know where your local health department is, you can check in your phone book. The CDC also has a tool to help you locate your state's health department, and many state websites also have listings of all of their local health departments. ° °3) Find a Walk-in Clinic °If your illness is not likely to be very severe or complicated, you may want to try a walk in clinic. These are popping up all over the country in pharmacies, drugstores, and shopping centers. They're usually staffed by nurse practitioners or physician assistants that have been trained to treat common illnesses and complaints. They're usually fairly quick and inexpensive. However, if you have serious medical issues or  chronic medical problems, these are probably not your best option. ° °No Primary Care Doctor: °- Call [facility] at  832-8000 - they can help you locate a primary care doctor that  accepts your insurance, provides certain services, etc. °- Physician Referral Service- 1-800-533-3463 ° °Chronic Pain Problems: °Organization         Address  Phone   Notes  °[facility]  (336) 297-2271 Patients need to be referred by their primary care doctor.  ° °Medication Assistance: °Organization         Address  Phone   Notes  °[place] [facility] [address] °[place], [place] [address] (336) 641-8030 --Must be a resident of [place] °-- Must have NO insurance coverage whatsoever (no Medicaid/ Medicare, etc.) °-- The pt. MUST have a primary care doctor that directs their care regularly and follows them in the community °  °[facility]  (866) 331-1348   °[employer]  (888) 892-1162   ° °Agencies that provide inexpensive medical care: °Organization         Address  Phone   Notes  °[facility]  (  336) 832-8035   °[facility]    (336) 832-7272   °[facility] [address] °[place], [place] [address] (336) 832-4777   °[facility] [address], °[place] (336) 271-4999   °[facility]    (336) 373-0678   °[facility]    (336) 272-1050   °[facility] ° [address], [place] Phone:  (336) 832-4444, Fax:  (336) 832-4440 Hours of Operation:  9 am - 6 pm, M-F.  Also accepts Medicaid/Medicare and self-pay.  °[facility] ° [address], [place] Phone: (336) [phone], Fax: (336) [phone]. Hours of Operation:  8:30 am - 5:30 pm, M-F.  Also accepts Medicaid and self-pay.  °[facility] [place] [address], [place] Phone: (336) 878-6027   °[facility] [address], [place], [place]  (336)723-1848, Ext. 123 Mondays & Thursdays: 7-9 AM.  First 15 patients are seen on a first come, first serve basis. °  ° °Medicaid-accepting [place] Providers: ° °Organization         Address  Phone   Notes  °[facility] [address], [place] (336) 641-2100 Also accepts self-pay patients.  °[facility] [address], [place] ° (336) 856-9996   °[facility] [address], [place] (336) 288-8857   °[facility] [address] [place] [address], [place] (336) 299-7000   °Veita Bland [address], [place]  ° (336) 373-1557 Only accepts [place] Access Medicaid patients after they have their name applied to their card.  ° °Self-Pay (no insurance) in [place]: ° °Organization         Address  Phone   Notes  °Sickle Cell Patients, [facility] [address], [place] (336) 832-1970   °[facility] [address], [place] (336) 832-4400   ° [facility] ° [address], [place] (336) 992-4800   °[facility]/Dr. Osei-Bonsu ° [address] [place] [address], [place] or [address], [place] (336) [phone] Phone number for both [place] and [place] locations is the same.  °[facility] [address], [place] (336) 299-0000   °[facility] [address] [place] [address], [place] or [address] (336) 852-7530 °(336) 878-2260   °[facility] [address], [place] (336) 350-1642, phone; (336) 294-5005, fax Sees patients 1st and 3rd Saturday of every month.  Must not qualify for public or private insurance (i.e. Medicaid, Medicare, [place] Health Choice, Veterans' Benefits) • Household income should be no more than 200% of the poverty level •The clinic cannot treat you if you are pregnant or think you are pregnant • Sexually transmitted  diseases are not treated at the clinic.  ° ° °Dental Care: °Organization         Address  Phone  Notes  °[facility] [address],  (336) 641-6152 Accepts children up to age 21 who are enrolled in Medicaid or [place] Health Choice; pregnant women with a Medicaid card; and children who have applied for Medicaid or [place] Health Choice, but were declined, whose parents can pay a reduced fee at time of service.  °[facility] [place]    [address], [place] (336) 641-7733 Accepts children up to age 21 who are enrolled in Medicaid or [place] Health Choice; pregnant women with a Medicaid card; and children who have applied for Medicaid or [place] Health Choice, but were declined, whose parents can pay a reduced fee at time of service.  °[facility] ° [address], [place] (336) 641-4533 Patients are seen by appointment only. Walk-ins are not accepted. [facility] will see patients 18 years of age and older. °Monday - Tuesday (8am-5pm) °Most Wednesdays (8:30-5pm) °$30 per visit, cash only  °[facility] ° [address], [place] (336) 641-4533 Patients are seen by appointment only. Walk-ins are not accepted. [facility] will see patients 18 years of age and older. °One Wednesday Evening (Monthly: Volunteer Based).  $30 per visit, cash only  °[facility]  (919) 537-3737 for adults; Children under age 4, call Graduate Pediatric Dentistry at ([phone]) [phone]. Children aged 4-14, please call ([phone]) [phone] to request a pediatric application. ° Dental services are provided in all areas of dental care including fillings, crowns and bridges, complete and partial dentures, implants, gum treatment, root canals, and extractions. Preventive care is also provided. Treatment is provided to both adults and children. °Patients are selected via a  lottery and there is often a waiting list. °  °[facility] [address], °[place] ° (336) 763-8833 [email or web address] °  °[facility] [address], [place], [place] (336)723-1848, Ext. 123 Second and Fourth Thursday of each month, opens at 6:30 AM; Clinic ends at 9 AM.  Patients are seen on a first-come first-served basis, and a limited number are seen during each clinic.  ° °[facility] ° [address], [place], [place] (336) 723-7904   Eligibility Requirements °You must have lived in [place], [place], or [place] counties for at least the last three months. °  You cannot be eligible for state or federal sponsored healthcare insurance, including Veterans Administration, Medicaid, or Medicare. °  You generally cannot be eligible for healthcare insurance through your employer.  °  How to apply: °Eligibility screenings are held every Tuesday and Wednesday afternoon from 1:00 pm until 4:00 pm. You do not need an appointment for the interview!  °[facility] [address], [place], [place] 336-631-2330   °[facility]  336-342-8273   °[facility]  336-703-3100   °[facility]  336-570-6415   ° °Behavioral Health Resources in the Community: °Intensive Outpatient Programs °Organization         Address  Phone  Notes  °[facility] [address], [place], [place] 336-878-6098   °[facility] [address], [place], [place] 336-832-9800   °[facility] [address], [place], [place] ° 336-882-2125   °[facility] [address],  °[place],  1-800-853-5163 or 336-641-4981   °Substance Abuse Resources °Organization         Address  Phone  Notes  °[facility]  336-882-2125   °[facility]  336-784-9470   °[facility]  336-285-9073   °[facility]  336-845-3988   °Residential &  Outpatient Substance Abuse Program  1-800-659-3381   °Psychological Services °Organization         Address  Phone  Notes  °[facility]  336- 832-9600   °  [facility]  336- 378-7881   °[facility] [address], [place] 1-800-853-5163 or 336-641-4981   ° °Mobile Crisis Teams °Organization         Address  Phone  Notes  °[facility], Mobile Crisis Care Unit  1-877-626-1772   °Assertive °[facility] ° [address] [place], [place] 336-834-9664   °Sharon DeEsch [address] °[place] 336-554-5454   ° °Self-Help/Support Groups °Organization         Address  Phone             Notes  °[facility] - variety of support groups  336- 373-1402 Call for more information  °Narcotics Anonymous (NA), [facility] [address], °[place]  2 meetings at this location  ° °Residential Treatment Programs °Organization         Address  Phone  Notes  °[facility] [address],    °[place]  1-866-801-8205   °[facility] ° [address], [place], [place] 704-293-8524   °[facility] [address], [place] 336-845-3988 Admissions: 8am-3pm M-F  °[facility] [address]    °[place], [place] 336-841-1104   °[facility] [address], [place], [place] 336-379-7146   °[facility] [address]  °[place], [place] 336-285-9073   °[facility] - Intensive Outpatient [address], [place], [place] 336-852-3033   °[facility] [address]  °[place], [place] 1-877-615-2722 or 336-784-9470   °[facility] [address] [place], [place] 336-227-7417 Accepts Medicaid  °[facility] [address]  ° [place] 1-800-659-3381 Substance Abuse/Addiction Treatment  ° °[place] Behavioral Health Resources °Organization          Address  Phone  Notes  °[facility]  (888) 581-9988   °Julie Brannon, PhD [address] [place], [place]   (336) 349-5553 or (336) 951-0000   °[facility]   [address] °[place], [place] (336) 349-4454   °[facility] [address], [place], [place] (336) 342-8316 Insurance/Medicaid/sponsorship through [facility]  °[facility] [address]                                    [place], [place] (336) 342-8316 Therapy/tele-psych/case  °[facility] [address]  ° [place],  (336) 349-2233    °Dr. Arfeen  (336) 349-4544   °[facility] [place]  [employer] [place] [facility] 1) [address], [place] °2) [address], [place] °3)  [address], [place] (336) 349-3220 °(336) 342-7768 ° °(336) 342-8140   °[place] [facility] (336) 342-1394 or (336) 342-3537 (After Hours)    ° ° ° °

## 2014-06-10 NOTE — Discharge Summary (Signed)
Name: Roy Jackson MRN: [id number] DOB: [birth year] 43 y.o. PCP: Lorre Munroe, NP  Date of Admission: 06/09/2014 11:11 AM Date of Discharge: 06/10/2014 Attending Physician: Aletta Edouard, MD  Discharge Diagnosis: Principal Problem:   Chest pain Active Problems:   Unstable angina  Discharge Medications:   Medication List         multivitamin with minerals Tabs tablet  Take 1 tablet by mouth daily.        Disposition and follow-up:   Roy Jackson was discharged from [facility] in Stable condition.  At the hospital follow up visit please address:  1. Chest pain. Please make sure patient follows up with cardiology to receive 2D echo and stress test. Evaluate if PPI is helping with his symptoms.  2.  Labs / imaging needed at time of follow-up: none  3.  Pending labs/ test needing follow-up: H Pylori  Follow-up Appointments:     Follow-up Information   Follow up with PCP. Schedule an appointment as soon as possible for a visit in 2 weeks.   Contact information:   See below for resources for finding a PCP      Schedule an appointment as soon as possible for a visit with KELLY,THOMAS A, MD. (Please call to set up an appointment for further cardiac evaluation including a 2D echocardiogram and a stress test.)    Specialty:  Cardiology   Contact information:   [address] [place] [address] [phone]       Discharge Instructions: Discharge Instructions   Call MD for:  difficulty breathing, headache or visual disturbances    Complete by:  As directed      Call MD for:  persistant dizziness or light-headedness    Complete by:  As directed      Call MD for:  persistant nausea and vomiting    Complete by:  As directed      Call MD for:  severe uncontrolled pain    Complete by:  As directed      Call MD for:  temperature >100.4    Complete by:  As directed      Diet - low sodium heart healthy    Complete by:  As  directed      Increase activity slowly    Complete by:  As directed            Consultations: Cardiology  Procedures Performed:  Dg Chest 2 View  06/09/2014   CLINICAL DATA:  Chest pain  EXAM: CHEST  2 VIEW  COMPARISON:  Chest radiograph 12/17/2011  FINDINGS: Stable cardiac and mediastinal contours. No consolidative pulmonary opacities. No pleural effusion or pneumothorax. Regional skeleton is unremarkable.  IMPRESSION: No acute cardiopulmonary process.   Electronically Signed   By: Annia Belt M.D.   On: 06/09/2014 12:43    Admission HPI: Roy Jackson is a 43 year old man with no significant past medical history presenting with chest pain x 1 day. He was standing around at work when he suddenly became diaphoretic. He had squeezing pain located in the mid sternal region and radiates to the left upper arm. The pain lasted for 5-6 hours and was 8-9/10 in severity at worst. He notes no exacerbating factors. It was relieved by nitroglycerin he received in the ED. He has never had pain like this in the past. Presently he feels chest soreness that is 1-2/10 in severity. He had associated lightheadedness, shortness of breath, nausea, and nonbloody vomiting  x3-4. He denies fevers, chills, cough, palpitations, exertional SOB, exertional chest pain, reflux, abdominal pain, diarrhea, dysuria, hematuria, legg swelling. He smokes half a pack a week of cigarettes x 1 year. He has a maternal grandfather and two maternal uncles that had MI in their 27s-50s. 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  Hospital Course by problem list: Principal Problem:   Chest pain Active Problems:   Unstable angina   1. Chest pain: differential includes acute coronary syndrome, PE, infectious aortic dissection, pneumothorax, GERD, MSK. Pain is not reproducible on palpation making MSK etiology less likely. Denies reflux symptoms making GERD less likely. CXR with no acute  cardiopulmonary process making pneumothorax or aortic dissection less likely. Wells score 0 and Geneva score 3 (heart rate 75-94) making him low risk for PE. He had S1Q3T3 pattern on his EKG but d-dimer is negative. Initial troponin negative and EKG without acute ischemic changes. His TIMI score is 0 presently. Cardiology consulted by ED. Troponin negative x 3. Lipase wnl. Lipid panel with normal total cholesterol, triglycerides, and HDL. Hemoglobin A1c 5.5.  UDS positive for opiates. FOBT negative. Repeat EKG unchanged. He was started on pantoprazole  daily. At discharge he had no recurrence of his chest pain. Per cardiology, patient can be evaluated as outpatient with 2D echo and nuclear stress test.   Discharge Vitals:   BP 113/70  Pulse 64  Temp(Src) 98.7 F (37.1 C) (Oral)  Resp 18  Ht  (1.702 m)  Wt 217 lb 9.5 oz (98.7 kg)  BMI 34.07 kg/m2  SpO2 100%  General Apperance: NAD  Head: Normocephalic, atraumatic  Eyes: PERRL, EOMI, anicteric sclera  Ears: Normal external ear canal  Nose: Nares normal, septum midline, mucosa normal  Throat: Lips, mucosa and tongue normal  Neck: Supple, trachea midline  Back: No tenderness or bony abnormality  Lungs: Clear to auscultation bilaterally. No wheezes, rhonchi or rales. Breathing comfortably  Chest Wall: Nontender, no deformity  Heart: Regular rate and rhythm, no murmur/rub/gallop  Abdomen: Soft, nontender, nondistended, no rebound/guarding  Extremities: Normal, atraumatic, warm and well perfused, no edema  Pulses: 2+ throughout  Skin: No rashes or lesions  Neurologic: Alert and oriented x 3. CNII-XII intact. Normal strength and sensation   Discharge Labs:  Results for orders placed during the hospital encounter of 06/09/14 (from the past 24 hour(s))  CBC     Status: None   Collection Time    06/09/14 11:32 AM      Result Value Ref Range   WBC 4.0  4.0 - 10.5 K/uL   RBC 4.50  4.22 - 5.81 MIL/uL   Hemoglobin 14.1  13.0 - 17.0  g/dL   HCT 16.1  09.6 - 04.5 %   MCV 90.9  78.0 - 100.0 fL   MCH 31.3  26.0 - 34.0 pg   MCHC 34.5  30.0 - 36.0 g/dL   RDW 40.9  81.1 - 91.4 %   Platelets 224  150 - 400 K/uL  BASIC METABOLIC PANEL     Status: Abnormal   Collection Time    06/09/14 11:32 AM      Result Value Ref Range   Sodium 144  137 - 147 mEq/L   Potassium 3.6 (*) 3.7 - 5.3 mEq/L   Chloride 107  96 - 112 mEq/L   CO2 26  19 - 32 mEq/L   Glucose, Bld 95  70 - 99 mg/dL   BUN 11  6 - 23 mg/dL   Creatinine, Ser 1.61  0.50 - 1.35 mg/dL   Calcium 8.6  8.4 - 09.6 mg/dL   GFR calc non Af Amer >90  >90 mL/min   GFR calc Af Amer >90  >90 mL/min   Anion gap 11  5 - 15  D-DIMER, QUANTITATIVE     Status: None   Collection Time    06/09/14 11:32 AM      Result Value Ref Range   D-Dimer, Quant <0.27  0.00 - 0.48 ug/mL-FEU  MAGNESIUM     Status: None   Collection Time    06/09/14 11:32 AM      Result Value Ref Range   Magnesium 2.1  1.5 - 2.5 mg/dL  HEPATIC FUNCTION PANEL     Status: None   Collection Time    06/09/14 11:32 AM      Result Value Ref Range   Total Protein 6.7  6.0 - 8.3 g/dL   Albumin 3.6  3.5 - 5.2 g/dL   AST 29  0 - 37 U/L   ALT 31  0 - 53 U/L   Alkaline Phosphatase 61  39 - 117 U/L   Total Bilirubin 0.4  0.3 - 1.2 mg/dL   Bilirubin, Direct <0.4  0.0 - 0.3 mg/dL   Indirect Bilirubin NOT CALCULATED  0.3 - 0.9 mg/dL  HEMOGLOBIN V4U     Status: None   Collection Time    06/09/14 11:32 AM      Result Value Ref Range   Hemoglobin A1C 5.5  <5.7 %   Mean Plasma Glucose 111  <117 mg/dL  I-STAT TROPOININ, ED     Status: None   Collection Time    06/09/14 11:57 AM      Result Value Ref Range   Troponin i, poc 0.00  0.00 - 0.08 ng/mL   Comment 3           TROPONIN I     Status: None   Collection Time    06/09/14  2:43 PM      Result Value Ref Range   Troponin I <0.30  <0.30 ng/mL  HEPARIN LEVEL (UNFRACTIONATED)     Status: Abnormal   Collection Time    06/09/14  6:48 PM      Result Value Ref  Range   Heparin Unfractionated <0.10 (*) 0.30 - 0.70 IU/mL  TROPONIN I     Status: None   Collection Time    06/09/14  8:17 PM      Result Value Ref Range   Troponin I <0.30  <0.30 ng/mL  TROPONIN I     Status: None   Collection Time    06/10/14  3:43 AM      Result Value Ref Range   Troponin I <0.30  <0.30 ng/mL  CBC     Status: Abnormal   Collection Time    06/10/14  3:43 AM      Result Value Ref Range   WBC 4.1  4.0 - 10.5 K/uL   RBC 4.17 (*) 4.22 - 5.81 MIL/uL   Hemoglobin 13.4  13.0 - 17.0 g/dL   HCT 98.1  19.1 - 47.8 %   MCV 94.5  78.0 - 100.0 fL   MCH 32.1  26.0 - 34.0 pg   MCHC 34.0  30.0 - 36.0 g/dL   RDW 29.5  62.1 - 30.8 %   Platelets 196  150 - 400 K/uL  LIPID PANEL  Status: Abnormal   Collection Time    06/10/14  3:43 AM      Result Value Ref Range   Cholesterol 166  0 - 200 mg/dL   Triglycerides 90  <132 mg/dL   HDL 43  >44 mg/dL   Total CHOL/HDL Ratio 3.9     VLDL 18  0 - 40 mg/dL   LDL Cholesterol 010 (*) 0 - 99 mg/dL  LIPASE, BLOOD     Status: None   Collection Time    06/10/14  3:43 AM      Result Value Ref Range   Lipase 32  11 - 59 U/L    Signed: Griffin Basil, MD 06/10/2014, 10:11 AM    Services Ordered on Discharge: None Equipment Ordered on Discharge: None

## 2014-06-14 LAB — HELICOBACTER PYLORI ABS-IGG+IGA, BLD
H Pylori IgA: 1 U/mL (ref <9.0)
H Pylori IgG: 0.43 {ISR}

## 2014-06-16 NOTE — Discharge Planning (Signed)
[facility] Liaison was not able to see the patient, [facility] orange card information and resources will be sent to the address provided.

## 2014-09-15 ENCOUNTER — Emergency Department: Payer: Self-pay | Admitting: Emergency Medicine

## 2014-09-15 LAB — CBC WITH DIFFERENTIAL/PLATELET
BASOS ABS: 0 10*3/uL (ref 0.0–0.1)
Basophil %: 0.8 %
Eosinophil #: 0 10*3/uL (ref 0.0–0.7)
Eosinophil %: 0.6 %
HCT: 45.7 % (ref 40.0–52.0)
HGB: 15.3 g/dL (ref 13.0–18.0)
LYMPHS PCT: 25.7 %
Lymphocyte #: 1.5 10*3/uL (ref 1.0–3.6)
MCH: 31.7 pg (ref 26.0–34.0)
MCHC: 33.4 g/dL (ref 32.0–36.0)
MCV: 95 fL (ref 80–100)
Monocyte #: 0.4 x10 3/mm (ref 0.2–1.0)
Monocyte %: 6.2 %
NEUTROS ABS: 3.8 10*3/uL (ref 1.4–6.5)
NEUTROS PCT: 66.7 %
Platelet: 253 10*3/uL (ref 150–440)
RBC: 4.81 10*6/uL (ref 4.40–5.90)
RDW: 13 % (ref 11.5–14.5)
WBC: 5.7 10*3/uL (ref 3.8–10.6)

## 2014-09-15 LAB — COMPREHENSIVE METABOLIC PANEL
ALBUMIN: 3.8 g/dL (ref 3.4–5.0)
ALK PHOS: 75 U/L
Anion Gap: 5 — ABNORMAL LOW (ref 7–16)
BILIRUBIN TOTAL: 0.3 mg/dL (ref 0.2–1.0)
BUN: 14 mg/dL (ref 7–18)
CHLORIDE: 110 mmol/L — AB (ref 98–107)
CO2: 28 mmol/L (ref 21–32)
CREATININE: 1.15 mg/dL (ref 0.60–1.30)
Calcium, Total: 8.5 mg/dL (ref 8.5–10.1)
EGFR (African American): >60
EGFR (Non-African Amer.): >60
GLUCOSE: 74 mg/dL (ref 65–99)
OSMOLALITY: 284 (ref 275–301)
POTASSIUM: 3.4 mmol/L — AB (ref 3.5–5.1)
SGOT(AST): 22 U/L (ref 15–37)
SGPT (ALT): 30 U/L
Sodium: 143 mmol/L (ref 136–145)
TOTAL PROTEIN: 7.8 g/dL (ref 6.4–8.2)

## 2014-09-15 LAB — TROPONIN I: Troponin-I: <0.02 ng/mL

## 2015-06-21 ENCOUNTER — Emergency Department (HOSPITAL_COMMUNITY)
Admission: EM | Admit: 2015-06-21 | Discharge: 2015-06-21 | Discharge status: Against Medical Advice | Payer: Self-pay | Attending: Emergency Medicine | Admitting: Emergency Medicine

## 2015-06-21 ENCOUNTER — Encounter (HOSPITAL_COMMUNITY): Payer: Self-pay | Admitting: Emergency Medicine

## 2015-06-21 DIAGNOSIS — R109 Unspecified abdominal pain: Secondary | ICD-10-CM | POA: Insufficient documentation

## 2015-06-21 DIAGNOSIS — Z720 Tobacco use: Secondary | ICD-10-CM | POA: Insufficient documentation

## 2015-06-21 NOTE — ED Notes (Signed)
Pt came to front stated that his pain was worsening. Shanda Bumps RN was notified in triage. I informed pt that we were working on getting him a room. Pt stated he was going to go to [facility] because they had no wait. Pt left.

## 2015-06-21 NOTE — ED Notes (Signed)
Pt sts increased pain in know right sided hernia after moving this week

## 2016-09-23 ENCOUNTER — Emergency Department (HOSPITAL_COMMUNITY)
Admission: EM | Admit: 2016-09-23 | Discharge: 2016-09-23 | Disposition: A | Discharge status: Home / Self Care | Payer: Managed Care, Other (non HMO) | Attending: Emergency Medicine | Admitting: Emergency Medicine

## 2016-09-23 ENCOUNTER — Encounter (HOSPITAL_COMMUNITY): Payer: Self-pay | Admitting: Emergency Medicine

## 2016-09-23 DIAGNOSIS — K047 Periapical abscess without sinus: Secondary | ICD-10-CM | POA: Diagnosis not present

## 2016-09-23 DIAGNOSIS — F17200 Nicotine dependence, unspecified, uncomplicated: Secondary | ICD-10-CM | POA: Diagnosis not present

## 2016-09-23 DIAGNOSIS — K0889 Other specified disorders of teeth and supporting structures: Secondary | ICD-10-CM | POA: Diagnosis present

## 2016-09-23 MED ORDER — CLINDAMYCIN PHOSPHATE 600 MG/50ML IV SOLN
600.0000 mg | Freq: Once | INTRAVENOUS | Status: AC
  Administered 2016-09-23: 600 mg via INTRAVENOUS
  Filled 2016-09-23: qty 50

## 2016-09-23 MED ORDER — CLINDAMYCIN HCL 300 MG PO CAPS: 300.0000 mg | ORAL_CAPSULE | Freq: Three times a day (TID) | ORAL | 0 refills | Status: DC

## 2016-09-23 MED ORDER — NAPROXEN 500 MG PO TABS: 500.0000 mg | ORAL_TABLET | Freq: Two times a day (BID) | ORAL | 0 refills | Status: DC

## 2016-09-23 MED ORDER — HYDROCODONE-ACETAMINOPHEN 5-325 MG PO TABS
1.0000 | ORAL_TABLET | Freq: Once | ORAL | Status: AC
  Administered 2016-09-23: 1 via ORAL
  Filled 2016-09-23: qty 1

## 2016-09-23 NOTE — ED Notes (Signed)
ED Provider at bedside. 

## 2016-09-23 NOTE — ED Triage Notes (Signed)
Pt sts left upper dental pain x 2 days  

## 2016-09-23 NOTE — ED Provider Notes (Signed)
[facility]-EMERGENCY DEPT Provider Note   CSN: [id number] Arrival date & time: 09/23/16  1812 By signing my name below, I, Bridgette HabermannMaria Tan, attest that this documentation has been prepared under the direction and in the presence of [facility] Kristol Almanzar, [place]. Electronically Signed: Bridgette HabermannMaria Tan, ED Scribe. 09/23/16. 6:37 PM.  History   Chief Complaint Chief Complaint  Patient presents with  . Dental Pain   HPI Comments: Roy Jackson is a 45 y.o. male with no pertinent PMHx, who presents to the Emergency Department complaining of left upper dental pain onset 2 days ago with associated left ear pain and left-sided facial swelling. Pt reports purulent-like drainage from the tooth area. He has been taking Ibuprofen with no relief. Pt notes he has no dentist at this time but has tried to call numerous dentists who state they cannot get him an appointment until next year. Pt denies fever, chills, nausea, vomiting, or any other associated symptoms.   The history is provided by the patient. No language interpreter was used.    History reviewed. No pertinent past medical history.  Patient Active Problem List   Diagnosis Date Noted  . Tobacco abuse 06/10/2014  . Unstable angina (HCC) 06/09/2014  . Chest pain 06/09/2014    Past Surgical History:  Procedure Laterality Date  . HERNIA REPAIR    . TONSILLECTOMY         Home Medications    Prior to Admission medications   Medication Sig Start Date End Date Taking? Authorizing Provider  clindamycin (CLEOCIN) 300 MG capsule Take 1 capsule (300 mg total) by mouth 3 (three) times daily. 09/23/16   Marylynn Rigdon Orlene OchM Saranne Crislip, NP  Multiple Vitamin (MULTIVITAMIN WITH MINERALS) TABS tablet Take 1 tablet by mouth daily.    Historical Provider, MD  naproxen (NAPROSYN) 500 MG tablet Take 1 tablet (500 mg total) by mouth 2 (two) times daily. 09/23/16   Onofre Gains Orlene OchM Tymeshia Awan, NP  pantoprazole (PROTONIX) 40 MG tablet Take 1 tablet (40 mg total) by mouth daily. 06/10/14   Lora PaulaJennifer T Krall, MD     Family History Family History  Problem Relation Age of Onset  . Heart attack      maternal GF died in 6250s, maternal uncles x 2 in 140s     Social History Social History  Substance Use Topics  . Smoking status: Current Some Day Smoker  . Smokeless tobacco: Not on file  . Alcohol use No     Allergies   Darvocet [propoxyphene n-acetaminophen]; Penicillins; and Sulfa antibiotics   Review of Systems Review of Systems  Constitutional: Negative for chills and fever.  HENT: Positive for dental problem and facial swelling.   Gastrointestinal: Negative for nausea and vomiting.  All other systems reviewed and are negative.    Physical Exam Updated Vital Signs BP 146/87 (BP Location: Right Arm)   Pulse 64   Temp 98.5 F (36.9 C) (Oral)   Resp 18   Ht 5\' 9"  (1.753 m)   Wt 101.6 kg   SpO2 100%   BMI 33.08 kg/m   Physical Exam  Constitutional: He is oriented to person, place, and time. He appears well-developed and well-nourished. No distress.  Appears uncomfortable  HENT:  Head: Atraumatic.  Mouth/Throat: Uvula is midline and oropharynx is clear and moist. Abnormal dentition. Dental caries present.    Tooth on the left upper tooth that is broken, decayed. Abscess to the gum above the tooth. Facial swelling on the left.  Eyes: Conjunctivae are normal.  Neck: Neck supple.  Anterior cervical lymph node enlargement on the left.  Cardiovascular: Normal rate and regular rhythm.   Pulmonary/Chest: Effort normal and breath sounds normal.  Abdominal: He exhibits no distension.  Musculoskeletal: Normal range of motion.  Lymphadenopathy:    He has cervical adenopathy.  Neurological: He is alert and oriented to person, place, and time.  Skin: Skin is warm and dry.  Psychiatric: He has a normal mood and affect. His behavior is normal.  Nursing note and vitals reviewed.    ED Treatments / Results  DIAGNOSTIC STUDIES: Oxygen Saturation is 100% on RA, normal by my  interpretation.    COORDINATION OF CARE: 6:36 PM Discussed treatment plan with pt at bedside which includes antibiotic Rx and pt agreed to plan.  Labs (all labs ordered are listed, but only abnormal results are displayed) Labs Reviewed  CBC WITH DIFFERENTIAL/PLATELET  BASIC METABOLIC PANEL   Radiology No results found.  Procedures Procedures (including critical care time)  Medications Ordered in ED Medications  clindamycin (CLEOCIN) IVPB 600 mg (600 mg Intravenous New Bag/Given 09/23/16 1859)  HYDROcodone-acetaminophen (NORCO/VICODIN) 5-325 MG per tablet 1 tablet (1 tablet Oral Given 09/23/16 1850)     Initial Impression / Assessment and Plan / ED Course  I have reviewed the triage vital signs and the nursing notes.  Clinical Course   Dr. Erma HeritageIsaacs in to I&D dental abscess (see note) Clindamycin 600 mg IV given in the ED.   Patient stable for d/c without fever and does not appear toxic. Patient feeling better after I&D. Will D/C home with antibiotics and Naprosyn. Dental referral given.   Final Clinical Impressions(s) / ED Diagnoses   Final diagnoses:  Dental abscess    New Prescriptions New Prescriptions   CLINDAMYCIN (CLEOCIN) 300 MG CAPSULE    Take 1 capsule (300 mg total) by mouth 3 (three) times daily.   NAPROXEN (NAPROSYN) 500 MG TABLET    Take 1 tablet (500 mg total) by mouth 2 (two) times daily.   I personally performed the services described in this documentation, which was scribed in my presence. The recorded information has been reviewed and is accurate.    [place] City[place] M Lateya Dauria, [place]NP 09/23/16 2047    Shaune Pollackameron Isaacs, MD 09/24/16 [phone]

## 2016-09-23 NOTE — Discharge Instructions (Signed)
Call dr. Stefani DamaBenitez's office in the morning for follow up.

## 2016-09-23 NOTE — ED Notes (Signed)
Family states that swelling seem to be worse. Airway checked and patent. Denies swallowing difficulty or feeling SOB. NP notified and aware

## 2016-09-24 NOTE — ED Provider Notes (Signed)
Dental Block Date/Time: 09/24/2016 8:46 PM Performed by: Shaune PollackISAACS, Zahria Ding Authorized by: Shaune PollackISAACS, Jency Schnieders   Consent:    Consent obtained:  Verbal   Consent given by:  Patient   Risks discussed:  Infection, intravascular injection, allergic reaction, hematoma, nerve damage, pain, swelling and unsuccessful block   Alternatives discussed:  Delayed treatment and alternative treatment Indications:    Indications: dental abscess   Location:    Block type:  Anterior superior alveolar   Laterality:  Left Procedure details (see MAR for exact dosages):    Topical anesthetic:  Lidocaine gel   Syringe type:  Aspirating dental syringe   Needle gauge:  27 G   Anesthetic injected:  Bupivacaine 0.25% w/o epi   Injection procedure:  Anatomic landmarks identified, anatomic landmarks palpated, negative aspiration for blood, introduced needle and incremental injection Post-procedure details:    Outcome:  Anesthesia achieved   Patient tolerance of procedure:  Tolerated well, no immediate complications .Marland Kitchen.Incision and Drainage Date/Time: 09/24/2016 8:47 PM Performed by: Shaune PollackISAACS, Avital Dancy Authorized by: Shaune PollackISAACS, Zyad Boomer   Consent:    Consent obtained:  Verbal   Consent given by:  Patient   Risks discussed:  Bleeding, damage to other organs, incomplete drainage, infection and pain   Alternatives discussed:  Delayed treatment, alternative treatment, observation and referral Location:    Type:  Abscess   Location:  Mouth   Mouth location:  Alveolar process Pre-procedure details:    Skin preparation:  Antiseptic wash Anesthesia (see MAR for exact dosages):    Anesthesia method:  Local infiltration   Local anesthetic:  Bupivacaine 0.25% w/o epi Procedure details:    Incision types:  Single straight   Scalpel blade:  11   Wound management:  Probed and deloculated   Drainage:  Purulent   Drainage amount:  Copious   Wound treatment:  Wound left open   Packing materials:  None Post-procedure details:    Patient tolerance of procedure:  Tolerated well, no immediate complications      Shaune Pollackameron Michaelina Blandino, MD 09/24/16 2047

## 2017-10-14 ENCOUNTER — Ambulatory Visit: Payer: BLUE CROSS/BLUE SHIELD | Admitting: Family Medicine

## 2017-10-14 ENCOUNTER — Encounter: Payer: Self-pay | Admitting: Family Medicine

## 2017-10-14 VITALS — BP 130/86 | HR 80 | Ht 70.0 in | Wt 242.0 lb

## 2017-10-14 DIAGNOSIS — R0683 Snoring: Secondary | ICD-10-CM

## 2017-10-14 DIAGNOSIS — Z9189 Other specified personal risk factors, not elsewhere classified: Secondary | ICD-10-CM | POA: Diagnosis not present

## 2017-10-14 DIAGNOSIS — R51 Headache: Secondary | ICD-10-CM

## 2017-10-14 DIAGNOSIS — R519 Headache, unspecified: Secondary | ICD-10-CM

## 2017-10-14 DIAGNOSIS — G8929 Other chronic pain: Secondary | ICD-10-CM | POA: Insufficient documentation

## 2017-10-14 DIAGNOSIS — G4719 Other hypersomnia: Secondary | ICD-10-CM

## 2017-10-14 LAB — CBC WITH DIFFERENTIAL/PLATELET
BASOS ABS: 9 {cells}/uL (ref 0–200)
Basophils Relative: 0.2 %
EOS PCT: 1.3 %
Eosinophils Absolute: 60 cells/uL (ref 15–500)
HEMATOCRIT: 43.8 % (ref 38.5–50.0)
HEMOGLOBIN: 15.1 g/dL (ref 13.2–17.1)
LYMPHS ABS: 1743 {cells}/uL (ref 850–3900)
MCH: 30.6 pg (ref 27.0–33.0)
MCHC: 34.5 g/dL (ref 32.0–36.0)
MCV: 88.8 fL (ref 80.0–100.0)
MPV: 9.6 fL (ref 7.5–12.5)
Monocytes Relative: 6.8 %
NEUTROS ABS: 2475 {cells}/uL (ref 1500–7800)
Neutrophils Relative %: 53.8 %
Platelets: 280 10*3/uL (ref 140–400)
RBC: 4.93 10*6/uL (ref 4.20–5.80)
RDW: 12.8 % (ref 11.0–15.0)
Total Lymphocyte: 37.9 %
WBC mixed population: 313 cells/uL (ref 200–950)
WBC: 4.6 10*3/uL (ref 3.8–10.8)

## 2017-10-14 LAB — COMPREHENSIVE METABOLIC PANEL
AG RATIO: 1.5 (calc) (ref 1.0–2.5)
ALBUMIN MSPROF: 4.7 g/dL (ref 3.6–5.1)
ALT: 22 U/L (ref 9–46)
AST: 19 U/L (ref 10–40)
Alkaline phosphatase (APISO): 72 U/L (ref 40–115)
BILIRUBIN TOTAL: 0.4 mg/dL (ref 0.2–1.2)
BUN: 12 mg/dL (ref 7–25)
CALCIUM: 9.8 mg/dL (ref 8.6–10.3)
CO2: 29 mmol/L (ref 20–32)
Chloride: 104 mmol/L (ref 98–110)
Creat: 1.07 mg/dL (ref 0.60–1.35)
Globulin: 3.2 g/dL (calc) (ref 1.9–3.7)
Glucose, Bld: 78 mg/dL (ref 65–99)
POTASSIUM: 4.3 mmol/L (ref 3.5–5.3)
SODIUM: 139 mmol/L (ref 135–146)
Total Protein: 7.9 g/dL (ref 6.1–8.1)

## 2017-10-14 LAB — TSH: TSH: 1.22 mIU/L (ref 0.40–4.50)

## 2017-10-14 NOTE — Patient Instructions (Signed)
Stop taking Aleve or other pain medication for headache for the next 2-4 weeks.   You will receive a call regarding the sleep study.   Keep a record of your headaches, symptoms and any triggers.   I will call you with lab results and see you back in 4 weeks or sooner if needed.

## 2017-10-14 NOTE — Progress Notes (Signed)
Subjective:    Patient ID: Roy Jackson, male    DOB: [birth year], 47 y.o.   MRN: [id number]  HPI Chief Complaint  Patient presents with  . Establish Care    new patient to establish care. Has been treated for migraines in the past. Has been having them for about 4-5 years and have been worsening.   . Flu Vaccine    declined.    He is new to the practice and here to establish care.  Previous medical care: no PCP   Complains of a 4-5 year history of intermittent bilateral headache that feels like sharp throbbing pain. Pain lasts from 2 days to 1 1/2 weeks.  3 headaches per month.  Last headache was last week and it lasted 1 1/2 weeks.  No numbness, tingling or weakness. No dizziness, vision changes.   States he went to the ED for headache but was not seen, states he did not want to wait. State he has not been evaluated for headaches.   No aura. Reports having photosensitivity, occasional nausea.   He takes 2 Aleve at onset then Tylenol and this improves pain. Reports that he has been taking Aleve, Excedrin and other pain relievers most days of the week for several months.   Reports having a CT in 2013 for headache that was from a work injury and a different type of headache than his current one.   PMH: reports since age 39 or 45 til age 6 he had numbness and weakness of bilateral LE. States this cleared up. Has been worked up. No issues with since age 82.   Reports having excessive daytime sleepiness. Wakes up often coughing or gasping for air. Witnessed sleep apnea. Snores. Denies being evaluated for sleep apnea in the past.   Last eye exam: December 9th 2018. He has contact lenses.   Divorced. 1 daughter. Works for [employer].   Denies fever, chills, dizziness, tinnitus, chest pain, palpitations, shortness of breath, abdominal pain, N/V/D, urinary symptoms, LE edema.   Reviewed allergies, medications, past medical, surgical, family, and social  history.     Review of Systems Pertinent positives and negatives in the history of present illness.     Objective:   Physical Exam  Constitutional: He is oriented to person, place, and time. He appears well-developed and well-nourished. No distress.  HENT:  Right Ear: Tympanic membrane and ear canal normal.  Left Ear: Tympanic membrane and ear canal normal.  Nose: Nose normal. Right sinus exhibits no maxillary sinus tenderness and no frontal sinus tenderness. Left sinus exhibits no maxillary sinus tenderness and no frontal sinus tenderness.  Mouth/Throat: Uvula is midline, oropharynx is clear and moist and mucous membranes are normal.  Eyes: Conjunctivae, EOM and lids are normal. Pupils are equal, round, and reactive to light.  Neck: Full passive range of motion without pain. Neck supple. No JVD present. No thyromegaly present.  Cardiovascular: Normal rate, regular rhythm, normal heart sounds and normal pulses.  No murmur heard. No LE edema  Pulmonary/Chest: Effort normal and breath sounds normal.  Musculoskeletal: Normal range of motion.  Lymphadenopathy:    He has no cervical adenopathy.  Neurological: He is alert and oriented to person, place, and time. He has normal strength and normal reflexes. No cranial nerve deficit or sensory deficit. He displays a negative Romberg sign. Coordination and gait normal.  Skin: Skin is warm and dry. No pallor.  Psychiatric: He has a normal mood and affect. His speech is  normal and behavior is normal. Thought content normal.   BP 130/86   Pulse 80   Ht 5\' 10"  (1.778 m)   Wt 242 lb (109.8 kg)   BMI 34.72 kg/m      Assessment & Plan:  Chronic intractable headache, unspecified headache type  Excessive daytime sleepiness - Plan: CBC with Differential/Platelet, Comprehensive metabolic panel, TSH, CANCELED: Split night study  At risk for sleep apnea - Plan: Split night study  Snores - Plan: Split night study, CANCELED: Split night  study  Epworth sleep scale 15  Discussed that his neuro exam is normal. No clear headache etiology, may be a type of migraine and possibly with components of tension type. Reviewed CT head from 2013 and reassured that a 4-5 history of ongoing intermittent headaches that are not worsening speaks to this not being anything serious.  He is at risk for sleep apnea and this needs further evaluation. Will send him for sleep study.  Plan to check labs, have him keep a close eye on headache triggers and symptoms. Advised him to stop taking all OTC pain relievers for now and see if overuse of these medications may be contributing to headaches.  Follow up in 4 weeks or sooner if needed.

## 2017-10-15 ENCOUNTER — Telehealth: Payer: Self-pay

## 2017-10-15 NOTE — Telephone Encounter (Signed)
-----   Message from Avanell ShackletonVickie L Henson, NP-C sent at 10/14/2017  9:55 PM EST ----- His blood work is all normal.

## 2017-10-15 NOTE — Telephone Encounter (Signed)
LVM, gave lab results. Gave call back number if any questions or concerns.  

## 2017-11-03 ENCOUNTER — Ambulatory Visit (HOSPITAL_BASED_OUTPATIENT_CLINIC_OR_DEPARTMENT_OTHER): Payer: BLUE CROSS/BLUE SHIELD | Attending: Family Medicine | Admitting: Internal Medicine

## 2017-11-03 DIAGNOSIS — Z9189 Other specified personal risk factors, not elsewhere classified: Secondary | ICD-10-CM

## 2017-11-03 DIAGNOSIS — G4733 Obstructive sleep apnea (adult) (pediatric): Secondary | ICD-10-CM | POA: Insufficient documentation

## 2017-11-03 DIAGNOSIS — R0683 Snoring: Secondary | ICD-10-CM | POA: Insufficient documentation

## 2017-11-08 DIAGNOSIS — Z9189 Other specified personal risk factors, not elsewhere classified: Secondary | ICD-10-CM

## 2017-11-08 NOTE — Procedures (Signed)
Patient Name: Roy Jackson, Roy Jackson Date: 11/03/2017 Gender: Male D.O.B: [birth year] Age (years): [age] Referring Provider: Avanell ShackletonVickie L Henson NP Height (inches): 68 Interpreting Physician: Jetty Duhamellinton Young MD, ABSM Weight (lbs): 243 RPSGT: Wylie HailDavis, Rico BMI: 37 MRN: [id number] Neck Size: 17.00 <br> <br> CLINICAL INFORMATION Sleep Jackson Type: NPSG Indication for sleep Jackson: Snoring  Epworth Sleepiness Score:  14  SLEEP Jackson TECHNIQUE As per the AASM Manual for the Scoring of Sleep and Associated Events v2.3 (April 2016) with a hypopnea requiring 4% desaturations.  The channels recorded and monitored were frontal, central and occipital EEG, electrooculogram (EOG), submentalis EMG (chin), nasal and oral airflow, thoracic and abdominal wall motion, anterior tibialis EMG, snore microphone, electrocardiogram, and pulse oximetry.  MEDICATIONS Medications self-administered by patient taken the night of the Jackson : none reported  SLEEP ARCHITECTURE The Jackson was initiated at 9:57:18 PM and ended at 4:11:30 AM.  Sleep onset time was 9.7 minutes and the sleep efficiency was 78.6%. The total sleep time was 294.0 minutes.  Stage REM latency was 65.5 minutes.  The patient spent 5.44% of the night in stage N1 sleep, 71.43% in stage N2 sleep, 0.00% in stage N3 and 23.13% in REM.  Alpha intrusion was absent.  Supine sleep was 92.86%.  RESPIRATORY PARAMETERS The overall apnea/hypopnea index (AHI) was 9.0 per hour. There were 11 total apneas, including 8 obstructive, 3 central and 0 mixed apneas. There were 33 hypopneas and 5 RERAs.  The AHI during Stage REM sleep was 30.9 per hour.  AHI while supine was 8.1 per hour.  The mean oxygen saturation was 94.54%. The minimum SpO2 during sleep was 81.00%. moderate snoring was noted during this Jackson.  CARDIAC DATA The 2 lead EKG demonstrated sinus rhythm. The mean heart rate was 58.93 beats per minute. Other EKG findings include: None.  LEG  MOVEMENT DATA The total PLMS were 0 with a resulting PLMS index of 0.00. Associated arousal with leg movement index was 0.0 .  IMPRESSIONS - Mild obstructive sleep apnea occurred during this Jackson (AHI = 9.0/h). - Insufficient early events to meet protocol requirements for split CPAP titration on this Jackson night. - No significant central sleep apnea occurred during this Jackson (CAI = 0.6/h). - Moderate oxygen desaturation was noted during this Jackson (Min O2 = 81.00%, Mean 94.5%). - The patient snored with moderate snoring volume. - No cardiac abnormalities were noted during this Jackson. - Clinically significant periodic limb movements did not occur during sleep. No significant associated arousals.  DIAGNOSIS - Obstructive Sleep Apnea (327.23 [G47.33 ICD-10])  RECOMMENDATIONS - Therapy for mild OSA is directed at symptoms with consideration of co-morbidities. Weight loss, a chin strap or oral appliance might be sufficient. Other options such as CPAP would be based on clinical judgment. - Be careful with alcohol, sedatives and other CNS depressants that may worsen sleep apnea and disrupt normal sleep architecture. - Sleep hygiene should be reviewed to assess factors that may improve sleep quality. - Weight management and regular exercise should be initiated or continued if appropriate.  [Electronically signed] 11/08/2017 02:17 PM  Jetty Duhamellinton Young MD, ABSM Diplomate, American Board of Sleep Medicine   NPI: [id number]                          Jetty Duhamellinton Young Diplomate, American Board of Sleep Medicine  ELECTRONICALLY SIGNED ON:  11/08/2017, 2:13 PM [facility] PH: [phone]   FX: [phone] ACCREDITED BY THE AMERICAN  ACADEMY OF SLEEP MEDICINE

## 2017-11-09 NOTE — Progress Notes (Signed)
Needs on office visit to discuss sleep study results. (I may have sent you a note on him already)

## 2017-11-12 ENCOUNTER — Encounter: Payer: Self-pay | Admitting: Family Medicine

## 2017-11-12 DIAGNOSIS — G4733 Obstructive sleep apnea (adult) (pediatric): Secondary | ICD-10-CM

## 2017-11-12 HISTORY — DX: Obstructive sleep apnea (adult) (pediatric): G47.33

## 2017-11-13 ENCOUNTER — Encounter: Payer: Self-pay | Admitting: Family Medicine

## 2017-11-13 ENCOUNTER — Ambulatory Visit: Payer: BLUE CROSS/BLUE SHIELD | Admitting: Family Medicine

## 2017-11-13 VITALS — BP 128/90 | HR 75 | Wt 238.4 lb

## 2017-11-13 DIAGNOSIS — G4733 Obstructive sleep apnea (adult) (pediatric): Secondary | ICD-10-CM | POA: Diagnosis not present

## 2017-11-13 NOTE — Progress Notes (Signed)
   Subjective:    Patient ID: Roy Jackson, male    DOB: 12-19-1970, 47 y.o.   MRN: [id number]  HPI Chief Complaint  Patient presents with  . 4 week follow-up    4 week follow-up on sleep study   He is here to follow up on abnormal sleep study and diagnosis of sleep apnea.  Denies any new concerns or complaints.   Denies fever, chills, dizziness, chest pain, palpitations, shortness of breath, abdominal pain, N/V/D, urinary symptoms, LE edema.   Reviewed allergies, medications, past medical, surgical, family, and social history.    Review of Systems Pertinent positives and negatives in the history of present illness.     Objective:   Physical Exam BP 128/90   Pulse 75   Wt 238 lb 6.4 oz (108.1 kg)   BMI 36.25 kg/m   Alert and oriented and in no acute distress. Not otherwise examined.       Assessment & Plan:  OSA (obstructive sleep apnea) - Plan: For home use only DME continuous positive airway pressure (CPAP)  Reviewed sleep report with patient. Discussed good sleep hygiene. He will avoid alcohol or sedatives. Follow up with [facility] for CPAP. He will return to see me after using the CPAP for 4 weeks.

## 2017-11-13 NOTE — Patient Instructions (Signed)
I will see you back after you have used the CPAP for 4 weeks.

## 2018-01-01 ENCOUNTER — Ambulatory Visit: Payer: Self-pay | Admitting: Family Medicine

## 2018-01-19 ENCOUNTER — Emergency Department (HOSPITAL_COMMUNITY)
Admission: EM | Admit: 2018-01-19 | Discharge: 2018-01-19 | Disposition: A | Discharge status: Home / Self Care | Payer: No Typology Code available for payment source | Attending: Emergency Medicine | Admitting: Emergency Medicine

## 2018-01-19 ENCOUNTER — Encounter (HOSPITAL_COMMUNITY): Payer: Self-pay | Admitting: Emergency Medicine

## 2018-01-19 DIAGNOSIS — F17200 Nicotine dependence, unspecified, uncomplicated: Secondary | ICD-10-CM | POA: Diagnosis not present

## 2018-01-19 DIAGNOSIS — S098XXA Other specified injuries of head, initial encounter: Secondary | ICD-10-CM | POA: Diagnosis present

## 2018-01-19 DIAGNOSIS — Y92410 Unspecified street and highway as the place of occurrence of the external cause: Secondary | ICD-10-CM | POA: Insufficient documentation

## 2018-01-19 DIAGNOSIS — Y999 Unspecified external cause status: Secondary | ICD-10-CM | POA: Diagnosis not present

## 2018-01-19 DIAGNOSIS — Y9389 Activity, other specified: Secondary | ICD-10-CM | POA: Insufficient documentation

## 2018-01-19 DIAGNOSIS — S060X0A Concussion without loss of consciousness, initial encounter: Secondary | ICD-10-CM | POA: Diagnosis not present

## 2018-01-19 LAB — URINALYSIS, ROUTINE W REFLEX MICROSCOPIC
BILIRUBIN URINE: NEGATIVE
Glucose, UA: NEGATIVE mg/dL
HGB URINE DIPSTICK: NEGATIVE
Ketones, ur: NEGATIVE mg/dL
Leukocytes, UA: NEGATIVE
Nitrite: NEGATIVE
PROTEIN: NEGATIVE mg/dL
Specific Gravity, Urine: 1.021 (ref 1.005–1.030)
pH: 6 (ref 5.0–8.0)

## 2018-01-19 LAB — CBC
HCT: 44.4 % (ref 39.0–52.0)
Hemoglobin: 15.3 g/dL (ref 13.0–17.0)
MCH: 31.7 pg (ref 26.0–34.0)
MCHC: 34.5 g/dL (ref 30.0–36.0)
MCV: 92.1 fL (ref 78.0–100.0)
Platelets: 261 10*3/uL (ref 150–400)
RBC: 4.82 MIL/uL (ref 4.22–5.81)
RDW: 13 % (ref 11.5–15.5)
WBC: 5.7 10*3/uL (ref 4.0–10.5)

## 2018-01-19 LAB — BASIC METABOLIC PANEL
Anion gap: 9 (ref 5–15)
BUN: 9 mg/dL (ref 6–20)
CALCIUM: 9.3 mg/dL (ref 8.9–10.3)
CO2: 26 mmol/L (ref 22–32)
Chloride: 106 mmol/L (ref 101–111)
Creatinine, Ser: 1.06 mg/dL (ref 0.61–1.24)
GFR calc non Af Amer: >60 mL/min (ref >60)
Glucose, Bld: 85 mg/dL (ref 65–99)
Potassium: 4 mmol/L (ref 3.5–5.1)
SODIUM: 141 mmol/L (ref 135–145)

## 2018-01-19 MED ORDER — TIZANIDINE HCL 4 MG PO CAPS: 4.0000 mg | ORAL_CAPSULE | Freq: Three times a day (TID) | ORAL | 0 refills | Status: DC
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
## 2018-01-19 NOTE — ED Notes (Signed)
ED Provider at bedside. 
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
## 2018-04-18 ENCOUNTER — Other Ambulatory Visit: Payer: Self-pay

## 2018-04-18 ENCOUNTER — Encounter (HOSPITAL_COMMUNITY): Payer: Self-pay | Admitting: *Deleted

## 2018-04-18 ENCOUNTER — Emergency Department (HOSPITAL_COMMUNITY)
Admission: EM | Admit: 2018-04-18 | Discharge: 2018-04-18 | Disposition: A | Discharge status: Home / Self Care | Payer: Self-pay | Attending: Emergency Medicine | Admitting: Emergency Medicine

## 2018-04-18 DIAGNOSIS — F17200 Nicotine dependence, unspecified, uncomplicated: Secondary | ICD-10-CM | POA: Insufficient documentation

## 2018-04-18 DIAGNOSIS — W57XXXA Bitten or stung by nonvenomous insect and other nonvenomous arthropods, initial encounter: Secondary | ICD-10-CM | POA: Insufficient documentation

## 2018-04-18 DIAGNOSIS — Z79899 Other long term (current) drug therapy: Secondary | ICD-10-CM | POA: Insufficient documentation

## 2018-04-18 DIAGNOSIS — R21 Rash and other nonspecific skin eruption: Secondary | ICD-10-CM | POA: Insufficient documentation

## 2018-04-18 DIAGNOSIS — L259 Unspecified contact dermatitis, unspecified cause: Secondary | ICD-10-CM | POA: Insufficient documentation

## 2018-04-18 MED ORDER — PREDNISONE 20 MG PO TABS: 40.0000 mg | ORAL_TABLET | Freq: Every day | ORAL | 0 refills | Status: DC

## 2018-04-18 MED ORDER — TRIAMCINOLONE ACETONIDE 0.1 % EX CREA: 1.0000 "application " | TOPICAL_CREAM | Freq: Two times a day (BID) | CUTANEOUS | 0 refills | Status: DC

## 2018-04-18 NOTE — ED Triage Notes (Signed)
The pt is c/o a rash on bothhis lower legs and in his groin area just noticed today he thinks  Its poison ivy or poison oalk

## 2018-04-18 NOTE — ED Provider Notes (Signed)
[facility] Provider Note   CSN: [id number] Arrival date & time: 04/18/18  1718     History   Chief Complaint Chief Complaint  Patient presents with  . Rash    HPI Roy Jackson is a 47 y.o. male.  HPI Roy Jackson is a 47 y.o. male presents to emergency department complaining of rash.  Patient has rash to bilateral ankles, as well as private area.  Patient states the rash on his ankles began over a week ago.  He believes it could be poison ivy.  He states he has been mowing grass and states that the rashes right above the sock line.  Rash is itchy, worse at nighttime.  He is also reporting a rash to his penis, that started 3 days ago after he pulled off a tick from that area.  He reports a swollen bump on that area and reports itching that is also worse at nighttime.  He has been applying steroid cream and calamine lotion which helps.  Denies any fever or chills.  No rash anywhere else on the body.  No new products including soaps, detergents, lotions.  States he has had similar rash to his ankle rash before and was told it was poison oak or ivy.  Past Medical History:  Diagnosis Date  . Chronic headaches   . OSA (obstructive sleep apnea) 11/12/2017   Per sleep study 10/2017    Patient Active Problem List   Diagnosis Date Noted  . OSA (obstructive sleep apnea) 11/12/2017  . Chronic headaches   . Tobacco abuse 06/10/2014  . Unstable angina (HCC) 06/09/2014  . Chest pain 06/09/2014    Past Surgical History:  Procedure Laterality Date  . HERNIA REPAIR    . TONSILLECTOMY          Home Medications    Prior to Admission medications   Medication Sig Start Date End Date Taking? Authorizing Provider  tiZANidine (ZANAFLEX) 4 MG capsule Take 1 capsule (4 mg total) by mouth 3 (three) times daily. 01/19/18   Gerhard MunchLockwood, Robert, MD    Family History Family History  Problem Relation Age of Onset  . Heart attack Unknown    maternal GF died in 5250s, maternal uncles x 2 in 8240s     Social History Social History   Tobacco Use  . Smoking status: Current Some Day Smoker  . Smokeless tobacco: Never Used  . Tobacco comment: last one 2-3 months ago  Substance Use Topics  . Alcohol use: Yes    Comment: occ  . Drug use: No     Allergies   Darvocet [propoxyphene n-acetaminophen]; Penicillins; and Sulfa antibiotics   Review of Systems Review of Systems  Constitutional: Negative for chills and fever.  Respiratory: Negative for cough, chest tightness and shortness of breath.   Cardiovascular: Negative for chest pain, palpitations and leg swelling.  Genitourinary: Positive for genital sores. Negative for discharge, penile pain, penile swelling, scrotal swelling and testicular pain.  Musculoskeletal: Negative for arthralgias, myalgias, neck pain and neck stiffness.  Skin: Positive for rash.  Allergic/Immunologic: Negative for immunocompromised state.  Neurological: Negative for dizziness, weakness, light-headedness, numbness and headaches.  All other systems reviewed and are negative.    Physical Exam Updated Vital Signs BP (!) 150/82 (BP Location: Right Arm)   Pulse 71   Temp 98.5 F (36.9 C) (Oral)   Resp 16   Ht 5\' 7"  (1.702 m)   Wt 108 kg (238 lb)  SpO2 96%   BMI 37.28 kg/m   Physical Exam  Constitutional: He appears well-developed and well-nourished. No distress.  HENT:  Mouth/Throat: Oropharynx is clear and moist.  No rash to oral mucosa, lips.   Eyes: Conjunctivae are normal.  Neck: Neck supple.  Cardiovascular: Normal rate.  Pulmonary/Chest: No respiratory distress.  Abdominal: He exhibits no distension.  Genitourinary: Penis normal. No penile tenderness.  Skin: Skin is warm and dry.  Erythematous, slightly raised papular rash, with scaling to bilateral ankles. Normal feet. No rash to the soles of feet. No rash in webspaces of toes. No rash to the groin bilaterally. No rash anywhere  else on body. Although pt reports rash to penis, I do not see any rash present.   Nursing note and vitals reviewed.    ED Treatments / Results  Labs (all labs ordered are listed, but only abnormal results are displayed) Labs Reviewed - No data to display  EKG None  Radiology No results found.  Procedures Procedures (including critical care time)  Medications Ordered in ED Medications - No data to display   Initial Impression / Assessment and Plan / ED Course  I have reviewed the triage vital signs and the nursing notes.  Pertinent labs & imaging results that were available during my care of the patient were reviewed by me and considered in my medical decision making (see chart for details).     Pt with erythematous, dry, slightly raised papular rash to bilateral ankles for about a week. Rash most consistent with contact dermatitis. Denies fever, chills. Did have a tick bite 3 days ago to his penis, but states ankle rash started prior to that. Since no fever or chills. No systemic symptoms. I do not think he has RMSF. Doubt scabies. No mucosal involvement. Will treat with steroids. Follow up with pcp or dermatology if worsening. Return precautions discussed  Vitals:   04/18/18 1724 04/18/18 1734  BP: (!) 150/82   Pulse: 71   Resp: 16   Temp: 98.5 F (36.9 C)   TempSrc: Oral   SpO2: 96%   Weight:  108 kg (238 lb)  Height:  5\' 7"  (1.702 m)      Final Clinical Impressions(s) / ED Diagnoses   Final diagnoses:  Rash  Contact dermatitis, unspecified contact dermatitis type, unspecified trigger    ED Discharge Orders    None       Jaynie Crumble, PA-C 04/18/18 1817    Loren Racer, MD 04/18/18 2312

## 2018-04-18 NOTE — Discharge Instructions (Addendum)
Apply kenalog cream twice a day. Prednisone until all gone. Benadryl for itching as needed. Follow up with your doctor or dermatology.

## 2018-08-31 ENCOUNTER — Telehealth: Payer: Self-pay | Admitting: Family Medicine

## 2018-08-31 NOTE — Telephone Encounter (Signed)
Pt dismissed letter in guarantor [employer]

## 2019-02-10 ENCOUNTER — Encounter: Payer: Self-pay | Admitting: Family Medicine

## 2019-02-10 ENCOUNTER — Other Ambulatory Visit: Payer: Self-pay

## 2019-02-10 ENCOUNTER — Ambulatory Visit: Payer: BLUE CROSS/BLUE SHIELD | Admitting: Family Medicine

## 2019-02-10 VITALS — Ht 69.0 in | Wt 240.0 lb

## 2019-02-10 DIAGNOSIS — R454 Irritability and anger: Secondary | ICD-10-CM | POA: Diagnosis not present

## 2019-02-10 DIAGNOSIS — G4700 Insomnia, unspecified: Secondary | ICD-10-CM

## 2019-02-10 DIAGNOSIS — G4733 Obstructive sleep apnea (adult) (pediatric): Secondary | ICD-10-CM

## 2019-02-10 DIAGNOSIS — G8929 Other chronic pain: Secondary | ICD-10-CM

## 2019-02-10 DIAGNOSIS — R519 Headache, unspecified: Secondary | ICD-10-CM

## 2019-02-10 DIAGNOSIS — M7989 Other specified soft tissue disorders: Secondary | ICD-10-CM

## 2019-02-10 DIAGNOSIS — R61 Generalized hyperhidrosis: Secondary | ICD-10-CM

## 2019-02-10 DIAGNOSIS — R51 Headache: Secondary | ICD-10-CM | POA: Diagnosis not present

## 2019-02-10 NOTE — Progress Notes (Signed)
Subjective:   Documentation for virtual audio and video telecommunications through [employer] encounter:  The patient was located at work in his office. 2 patient identifiers used.  The provider was located in the office. The patient did consent to this visit and is aware of possible charges through their insurance for this visit.  The other persons participating in this telemedicine service were none.    Patient ID: Roy Jackson, male    DOB: 06-10-1971, 48 y.o.   MRN: [id number]  HPI Chief Complaint  Patient presents with  . sleeping trouble    trouble sleeping, headaches, night sweats, moody and attitude bad X 2 years    Complains of multiple concerns including sleep issues, intermittent dizziness, occasional leg weakness, headaches, nausea, night sweats (6-7 months). All of his symptoms have been ongoing for at least 6 months and some for 2 years. No fever, chills, URI symptoms, chest pain, palpitations, abdominal pain, diarrhea.    Shortness of breath intermittently but mainly at night. Is not currently using a CPAP for OSA.  States CPAP was not helping. His insurance did not cover it and he took the CPAP back. Now he is waking up gasping for air. Waking up with headaches.  States both of his hand have been swelling for the past 2 months. No   Denies suicidal thoughts.   States he is irritable and does not want to be around people. States his job requires that he be nice all the time, he works as a Community education officer.     Depression screen [facility] 2/9 02/10/2019 11/13/2017  Decreased Interest 0 0  Down, Depressed, Hopeless 3 0  PHQ - 2 Score 3 0  Altered sleeping 3 -  Tired, decreased energy 3 -  Change in appetite 1 -  Feeling bad or failure about yourself  3 -  Trouble concentrating 3 -  Moving slowly or fidgety/restless 3 -  Suicidal thoughts 0 -  PHQ-9 Score 19 -  Difficult doing work/chores Somewhat difficult -     Review of Systems Pertinent positives and negatives  in the history of present illness.     Objective:   Physical Exam Ht 5\' 9"  (1.753 m)   Wt 240 lb (108.9 kg)   BMI 35.44 kg/m   Alert and oriented and in no acute distress.  Respirations unlabored.  Speaking in complete sentences without difficulty.  Clear speech.  Mood is normal appearing, actually quite pleasant and thought processes normal.  Denies SI or HI.      Assessment & Plan:  Bilateral hand swelling  Irritability  OSA (obstructive sleep apnea)  Chronic intractable headache, unspecified headache type  Night sweats  Insomnia, unspecified type Discussed limitations of a virtual visit. He has multiple complaints and it is unclear as to if they are all interconnected.  His symptoms have been going on for several months to years.  He is in no acute distress. Reviewed previous labs from over a year ago.   Discussed that sleep apnea could be contributing to some of his symptoms including insomnia, headaches and the shortness of breath at night. Encouraged him to come in the office for a visit today or tomorrow and that we need to check lab work however he cannot come in until Monday.  He does not have any COVID symptoms and has no known exposures to COVID-19.  If he develops any new symptoms over the weekend he will call Monday before coming in. We will need to also  attempt to get him to start using a CPAP again.  He states he is now on his wife's insurance and hopefully it would be covered. He will come in Monday morning for a visit and labs  Time spent on call was 15 minutes and in review of previous records 2 minutes total.  This virtual service is not related to other E/M service within previous 7 days.

## 2019-02-11 ENCOUNTER — Encounter: Payer: Self-pay | Admitting: Family Medicine

## 2019-02-11 ENCOUNTER — Ambulatory Visit: Payer: BLUE CROSS/BLUE SHIELD | Admitting: Family Medicine

## 2019-02-11 VITALS — BP 128/80 | HR 66 | Temp 98.4°F | Resp 16 | Wt 249.2 lb

## 2019-02-11 DIAGNOSIS — G4733 Obstructive sleep apnea (adult) (pediatric): Secondary | ICD-10-CM | POA: Diagnosis not present

## 2019-02-11 DIAGNOSIS — G4719 Other hypersomnia: Secondary | ICD-10-CM | POA: Insufficient documentation

## 2019-02-11 DIAGNOSIS — R202 Paresthesia of skin: Secondary | ICD-10-CM | POA: Insufficient documentation

## 2019-02-11 DIAGNOSIS — M7989 Other specified soft tissue disorders: Secondary | ICD-10-CM | POA: Diagnosis not present

## 2019-02-11 DIAGNOSIS — R5383 Other fatigue: Secondary | ICD-10-CM | POA: Insufficient documentation

## 2019-02-11 DIAGNOSIS — M79641 Pain in right hand: Secondary | ICD-10-CM

## 2019-02-11 DIAGNOSIS — R51 Headache: Secondary | ICD-10-CM

## 2019-02-11 DIAGNOSIS — G8929 Other chronic pain: Secondary | ICD-10-CM

## 2019-02-11 DIAGNOSIS — R519 Headache, unspecified: Secondary | ICD-10-CM

## 2019-02-11 LAB — POCT URINALYSIS DIP (PROADVANTAGE DEVICE)
Bilirubin, UA: NEGATIVE
Blood, UA: NEGATIVE
Glucose, UA: NEGATIVE mg/dL
Ketones, POC UA: NEGATIVE mg/dL
Leukocytes, UA: NEGATIVE
Nitrite, UA: NEGATIVE
Specific Gravity, Urine: 1.02
Urobilinogen, Ur: NEGATIVE
pH, UA: 6 (ref 5.0–8.0)

## 2019-02-11 NOTE — Patient Instructions (Addendum)
Try taking 2 Aleve twice daily with food for the next couple of weeks to see if this helps with your right wrist and hand pain. You may also want to try a wrist splint at bedtime to see if this helps.   Please call [facility] to see about starting back on the CPAP. [phone] Patsy Lager is located on [address], [address] Ginette Otto.   We will call you Monday with your results.   Carpal Tunnel Syndrome  Carpal tunnel syndrome is a condition that causes pain in your hand and arm. The carpal tunnel is a narrow area that is on the palm side of your wrist. Repeated wrist motion or certain diseases may cause swelling in the tunnel. This swelling can pinch the main nerve in the wrist (median nerve). What are the causes? This condition may be caused by:  Repeated wrist motions.  Wrist injuries.  Arthritis.  A sac of fluid (cyst) or abnormal growth (tumor) in the carpal tunnel.  Fluid buildup during pregnancy. Sometimes the cause is not known. What increases the risk? The following factors may make you more likely to develop this condition:  Having a job in which you move your wrist in the same way many times. This includes jobs like being a Midwife or a Conservation officer, nature.  Being a woman.  Having other health conditions, such as: ? Diabetes. ? Obesity. ? A thyroid gland that is not active enough (hypothyroidism). ? Kidney failure. What are the signs or symptoms? Symptoms of this condition include:  A tingling feeling in your fingers.  Tingling or a loss of feeling (numbness) in your hand.  Pain in your entire arm. This pain may get worse when you bend your wrist and elbow for a long time.  Pain in your wrist that goes up your arm to your shoulder.  Pain that goes down into your palm or fingers.  A weak feeling in your hands. You may find it hard to grab and hold items. You may feel worse at night. How is this diagnosed? This condition is diagnosed with a medical history and physical exam.  You may also have tests, such as:  Electromyogram (EMG). This test checks the signals that the nerves send to the muscles.  Nerve conduction study. This test checks how well signals pass through your nerves.  Imaging tests, such as X-rays, ultrasound, and MRI. These tests check for what might be the cause of your condition. How is this treated? This condition may be treated with:  Lifestyle changes. You will be asked to stop or change the activity that caused your problem.  Doing exercise and activities that make bones and muscles stronger (physical therapy).  Learning how to use your hand again (occupational therapy).  Medicines for pain and swelling (inflammation). You may have injections in your wrist.  A wrist splint.  Surgery. Follow these instructions at home: If you have a splint:  Wear the splint as told by your doctor. Remove it only as told by your doctor.  Loosen the splint if your fingers: ? Tingle. ? Lose feeling (become numb). ? Turn cold and blue.  Keep the splint clean.  If the splint is not waterproof: ? Do not let it get wet. ? Cover it with a watertight covering when you take a bath or a shower. Managing pain, stiffness, and swelling   If told, put ice on the painful area: ? If you have a removable splint, remove it as told by your doctor. ? Put ice  in a plastic bag. ? Place a towel between your skin and the bag. ? Leave the ice on for 20 minutes, 2-3 times per day. General instructions  Take over-the-counter and prescription medicines only as told by your doctor.  Rest your wrist from any activity that may cause pain. If needed, talk with your boss at work about changes that can help your wrist heal.  Do any exercises as told by your doctor, physical therapist, or occupational therapist.  Keep all follow-up visits as told by your doctor. This is important. Contact a doctor if:  You have new symptoms.  Medicine does not help your  pain.  Your symptoms get worse. Get help right away if:  You have very bad numbness or tingling in your wrist or hand. Summary  Carpal tunnel syndrome is a condition that causes pain in your hand and arm.  It is often caused by repeated wrist motions.  Lifestyle changes and medicines are used to treat this problem. Surgery may help in very bad cases.  Follow your doctor's instructions about wearing a splint, resting your wrist, keeping follow-up visits, and calling for help. This information is not intended to replace advice given to you by your health care provider. Make sure you discuss any questions you have with your health care provider. Document Released: 09/11/2011 Document Revised: 01/29/2018 Document Reviewed: 01/29/2018 Elsevier Interactive Patient Education  2019 [employer]

## 2019-02-11 NOTE — Progress Notes (Signed)
Subjective:    Patient ID: Roy Jackson, male    DOB: 1971-09-22, 48 y.o.   MRN: [id number]  HPI Chief Complaint  Patient presents with  . follow-up    follow-up on headache, joint pain and hand swelling. and some nasuea   Here today for an in office visit after doing a virtual visit yesterday.  Reports his main concern is fatigue, chronic headaches, and bilateral hand swelling.   Complains of multiple concerns including sleep issues, intermittent dizziness, occasional leg weakness, headaches, nausea, night sweats (6-7 months). All of his symptoms have been ongoing for at least 6 months and some for 2 years.  No fever, chills, URI symptoms, chest pain, palpitations, abdominal pain, diarrhea.    Shortness of breath intermittently but mainly at night. Is not currently using a CPAP for OSA.  States CPAP was not helping. His insurance did not cover it and he took the CPAP back. Now he is waking up gasping for air. Waking up with headaches.  States both of his hands have been swelling for the past 2 months intermittently.  Right hand with numbness, tingling in his index and middle finger and subjective occasional weakness bilaterally. Occasional morning stiffness of his hands but this seems to be worse later in the day.  Denies any other arthralgias.   Denies suicidal thoughts.   States he is irritable and does not want to be around people. States his job requires that he be nice all the time, he works as a Community education officercar salesman.  Reviewed allergies, medications, past medical, surgical, family, and social history.    Review of Systems Pertinent positives and negatives in the history of present illness.     Objective:   Physical Exam BP 128/80   Pulse 66   Temp 98.4 F (36.9 C) (Oral)   Resp 16   Wt 249 lb 3.2 oz (113 kg)   SpO2 97%   BMI 36.80 kg/m   Alert and oriented and in no distress. No sinus tenderness. Tympanic membranes and canals are normal. Pharyngeal area is  normal. Neck is supple without adenopathy or thyromegaly. Cardiac exam shows a regular sinus rhythm without murmurs or gallops. Lungs are clear to auscultation. Abdomen soft, non distended, non tender. Normal sensation and ROM of all joints. No TTP of hands. Equal grip strength. Negative tinels, positive phalen's on right. Bilateral LE are neurovascularly intact and no edema, equal leg strength, normal gait. Skin is warm and dry, no rash or pallor. PERRLA, EOMs intact, normal conjunctiva. CNs intact.       Assessment & Plan:  Fatigue, unspecified type - Plan: CBC with Differential/Platelet, Comprehensive metabolic panel, POCT Urinalysis DIP (Proadvantage Device), TSH, T4, free, T3, HIV Antibody (routine testing w rflx), VITAMIN D 25 Hydroxy (Vit-D Deficiency, Fractures), Vitamin B12  OSA (obstructive sleep apnea)  Chronic intractable headache, unspecified headache type  Excessive daytime sleepiness  Bilateral hand swelling - Plan: ANA, Sedimentation rate, Rheumatoid factor  Paresthesia of hand, bilateral - Plan: RPR, HIV Antibody (routine testing w rflx), Vitamin B12  Paresthesia of right leg - Plan: RPR, HIV Antibody (routine testing w rflx), Vitamin B12  Right hand pain - Plan: ANA, Rheumatoid factor  An office visit for evaluation of multiple symptoms after a virtual visit yesterday. Discussed multiple etiologies for fatigue and will check labs to look for underlying etiology.  Suspect sleep apnea is contributing to fatigue, headaches daytime sleepiness and irritability.  Advised him to contact [facility] and ask about getting a  new CPAP since he has new insurance.  I will be happy to put a order in for this if he decides to do it. 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  Discussed the possibility of a referral to a hand specialist if this is not improving.  He reports subjective bilateral hand weakness however he does have  normal and symmetric strength today in his upper and lower extremities. Follow-up pending labs

## 2019-02-14 ENCOUNTER — Ambulatory Visit: Payer: BLUE CROSS/BLUE SHIELD | Admitting: Family Medicine

## 2019-02-14 LAB — CBC WITH DIFFERENTIAL/PLATELET
Basophils Absolute: 0 10*3/uL (ref 0.0–0.2)
Basos: 0 %
EOS (ABSOLUTE): 0 10*3/uL (ref 0.0–0.4)
Eos: 0 %
Hematocrit: 40.5 % (ref 37.5–51.0)
Hemoglobin: 14.8 g/dL (ref 13.0–17.7)
Immature Grans (Abs): 0 10*3/uL (ref 0.0–0.1)
Immature Granulocytes: 0 %
Lymphocytes Absolute: 1.3 10*3/uL (ref 0.7–3.1)
Lymphs: 34 %
MCH: 32.8 pg (ref 26.6–33.0)
MCHC: 36.5 g/dL — ABNORMAL HIGH (ref 31.5–35.7)
MCV: 90 fL (ref 79–97)
Monocytes Absolute: 0.3 10*3/uL (ref 0.1–0.9)
Monocytes: 7 %
Neutrophils Absolute: 2.3 10*3/uL (ref 1.4–7.0)
Neutrophils: 59 %
Platelets: 250 10*3/uL (ref 150–450)
RBC: 4.51 x10E6/uL (ref 4.14–5.80)
RDW: 13 % (ref 11.6–15.4)
WBC: 3.8 10*3/uL (ref 3.4–10.8)

## 2019-02-14 LAB — RPR: RPR Ser Ql: NONREACTIVE

## 2019-02-14 LAB — COMPREHENSIVE METABOLIC PANEL
ALT: 31 IU/L (ref 0–44)
AST: 20 IU/L (ref 0–40)
Albumin/Globulin Ratio: 1.5 (ref 1.2–2.2)
Albumin: 4.4 g/dL (ref 4.0–5.0)
Alkaline Phosphatase: 82 IU/L (ref 39–117)
BUN/Creatinine Ratio: 10 (ref 9–20)
BUN: 10 mg/dL (ref 6–24)
Bilirubin Total: 0.3 mg/dL (ref 0.0–1.2)
CO2: 23 mmol/L (ref 20–29)
Calcium: 9.5 mg/dL (ref 8.7–10.2)
Chloride: 104 mmol/L (ref 96–106)
Creatinine, Ser: 1.01 mg/dL (ref 0.76–1.27)
GFR calc Af Amer: 102 mL/min/{1.73_m2} (ref >59)
GFR calc non Af Amer: 88 mL/min/{1.73_m2} (ref >59)
Globulin, Total: 3 g/dL (ref 1.5–4.5)
Glucose: 85 mg/dL (ref 65–99)
Potassium: 4.4 mmol/L (ref 3.5–5.2)
Sodium: 142 mmol/L (ref 134–144)
Total Protein: 7.4 g/dL (ref 6.0–8.5)

## 2019-02-14 LAB — HIV ANTIBODY (ROUTINE TESTING W REFLEX): HIV Screen 4th Generation wRfx: NONREACTIVE

## 2019-02-14 LAB — SEDIMENTATION RATE: Sed Rate: 33 mm/hr — ABNORMAL HIGH (ref 0–15)

## 2019-02-14 LAB — ANA: Anti Nuclear Antibody (ANA): NEGATIVE

## 2019-02-14 LAB — VITAMIN D 25 HYDROXY (VIT D DEFICIENCY, FRACTURES): Vit D, 25-Hydroxy: 17.1 ng/mL — ABNORMAL LOW (ref 30.0–100.0)

## 2019-02-14 LAB — T3: T3, Total: 121 ng/dL (ref 71–180)

## 2019-02-14 LAB — VITAMIN B12: Vitamin B-12: 353 pg/mL (ref 232–1245)

## 2019-02-14 LAB — TSH: TSH: 1.06 u[IU]/mL (ref 0.450–4.500)

## 2019-02-14 LAB — RHEUMATOID FACTOR: Rhuematoid fact SerPl-aCnc: <10 IU/mL (ref 0.0–13.9)

## 2019-02-14 LAB — T4, FREE: Free T4: 1.14 ng/dL (ref 0.82–1.77)

## 2019-03-14 ENCOUNTER — Telehealth: Payer: Self-pay | Admitting: Family Medicine

## 2019-03-14 NOTE — Telephone Encounter (Signed)
Pt called & said vickie had referred him for CPAP and they need copy of his CPAP faxed to [facility] fax # [phone].  This was faxed to [facility]

## 2019-03-28 ENCOUNTER — Ambulatory Visit: Payer: BC Managed Care – PPO | Admitting: Family Medicine

## 2019-03-28 ENCOUNTER — Other Ambulatory Visit: Payer: Self-pay

## 2019-03-28 ENCOUNTER — Encounter: Payer: Self-pay | Admitting: Family Medicine

## 2019-03-28 VITALS — BP 122/80 | HR 84 | Temp 97.6°F | Wt 248.2 lb

## 2019-03-28 DIAGNOSIS — R519 Headache, unspecified: Secondary | ICD-10-CM

## 2019-03-28 DIAGNOSIS — G8929 Other chronic pain: Secondary | ICD-10-CM | POA: Insufficient documentation

## 2019-03-28 DIAGNOSIS — R5383 Other fatigue: Secondary | ICD-10-CM | POA: Diagnosis not present

## 2019-03-28 DIAGNOSIS — R6882 Decreased libido: Secondary | ICD-10-CM

## 2019-03-28 DIAGNOSIS — R700 Elevated erythrocyte sedimentation rate: Secondary | ICD-10-CM

## 2019-03-28 DIAGNOSIS — R61 Generalized hyperhidrosis: Secondary | ICD-10-CM

## 2019-03-28 DIAGNOSIS — E559 Vitamin D deficiency, unspecified: Secondary | ICD-10-CM

## 2019-03-28 DIAGNOSIS — Z8639 Personal history of other endocrine, nutritional and metabolic disease: Secondary | ICD-10-CM

## 2019-03-28 DIAGNOSIS — G4733 Obstructive sleep apnea (adult) (pediatric): Secondary | ICD-10-CM | POA: Diagnosis not present

## 2019-03-28 DIAGNOSIS — R51 Headache: Secondary | ICD-10-CM

## 2019-03-28 HISTORY — DX: Personal history of other endocrine, nutritional and metabolic disease: Z86.39

## 2019-03-28 NOTE — Progress Notes (Signed)
Subjective:    Patient ID: Roy Jackson, male    DOB: November 19, 1970, 48 y.o.   MRN: [id number]  HPI Chief Complaint  Patient presents with  . follow-up on labs    follow-up on lab results   Here to follow up on multiple chronic health conditions including fatigue, insomnia, night sweats, generalized muscle weakness and chronic daily headaches. Today he admits that he has a history of low testosterone and was told 5-6 years ago that he should be on testosterone replacement. He did not share this with me at his previous visit.   States he has very low libido and does not get spontaneous erections.   Vitamin D def- Taking Vitamin D and B12 supplements for the past 6 weeks and cannot tell a difference in energy level.   He has OSA- has not started using CPAP but states he has appt to get CPAP later today.   Sed rate was mildly elevated at his previous visit.  Negative RAF, ANA.  No explanation for his symptoms.   I questioned history of angina in his record. He reports he was worked up for this and nothing was found. Denies chest pain, DOE. States he thinks he his heart beats faster than it should sporadically. States this occurs once every 3 weeks or so and lasts for a couple of minutes.  Family history reveals maternal uncle with a heart attack in his 5450s. No one else has hx of heart disease per patient.  States his father died by gunshot wound when patient was age 89.   No longer having as much swelling in hands. No numbness, tingling.  Now he appears to have trigger finger in his right 4th finger. States this is not bothersome and does not want to see a hand specialist at this point.   No other concerns today.  He was coughing during the visit today. States he worked outside this weekend and does not feel sick. He is wearing a mask.   Denies fever, chills, unexplained weight loss, dizziness, chest pain, shortness of breath, abdominal pain, N/V/D, urinary symptoms, LE edema.    Reviewed allergies, medications, past medical, surgical, family, and social history.   Review of Systems Pertinent positives and negatives in the history of present illness.     Objective:   Physical Exam BP 122/80   Pulse 84   Temp 97.6 F (36.4 C) (Oral)   Wt 248 lb 3.2 oz (112.6 kg)   SpO2 98%   BMI 36.65 kg/m   Alert and in no distress.  Neck is supple without adenopathy or thyromegaly. Cardiac exam shows a regular sinus rhythm without murmurs or gallops. Lungs are clear to auscultation. Extremities without edema. Skin is warm and dry. Right hand exam shows mild catching of his 4th finger without any edema, TTP. Bilateral hand exam unremarkable otherwise.      Assessment & Plan:  Fatigue, unspecified type - Plan: Testosterone, Vitamin B12, suspect sleep apnea may be contributing to his fatigue.  Reviewed labs and he did have low vitamin D and low normal B12.  He has been taking supplements has not seen improvement.  Check testosterone level since he does have a history of hypogonadism and I was not aware of this until today.  OSA (obstructive sleep apnea) - Plan: He reportedly has an appointment later today to pick up his CPAP.   Night sweats - Plan: Reviewed recent lab results are no explanation for symptoms.  Chronic intractable headache, unspecified  headache type - Plan: Monitor after using CPAP for sleep apnea.  Consider referral to neurology.  Vitamin D deficiency - Plan: VITAMIN D 25 Hydroxy (Vit-D Deficiency, Fractures), continue on vitamin D supplement  History of hypogonadism - Plan: Testosterone, was not aware until today that he has a history of hypogonadism.  Check testosterone level.  Decreased libido - Plan: Check testosterone level and follow-up  Elevated sed rate - Plan: Sedimentation rate, no sign of inflammation on exam today.  Recheck sed rate.  Discussed referral to neurology for insomnia and headaches if he is not noticing any improvement after using  CPAP for at least 2 weeks. Offered to refer him to a hand specialist for trigger finger.  He declines. Consider referral to rheumatology for fatigue if he is not improving with CPAP. Discussed that if he has additional episodes of palpitations or if he has any chest pain or DOE that we will need to evaluate this.

## 2019-03-28 NOTE — Patient Instructions (Signed)
Continue taking vitamin D and a good multi-vitamin.   Call me after using the CPAP for at least 2 weeks. We can do a virtual visit at that time.   We will call you with your results.

## 2019-03-29 ENCOUNTER — Other Ambulatory Visit: Payer: Self-pay | Admitting: Family Medicine

## 2019-03-29 DIAGNOSIS — E291 Testicular hypofunction: Secondary | ICD-10-CM

## 2019-03-29 LAB — VITAMIN B12: Vitamin B-12: 382 pg/mL (ref 232–1245)

## 2019-03-29 LAB — VITAMIN D 25 HYDROXY (VIT D DEFICIENCY, FRACTURES): Vit D, 25-Hydroxy: 21.7 ng/mL — ABNORMAL LOW (ref 30.0–100.0)

## 2019-03-29 LAB — TESTOSTERONE: Testosterone: 241 ng/dL — ABNORMAL LOW (ref 264–916)

## 2019-03-29 LAB — SEDIMENTATION RATE: Sed Rate: 11 mm/hr (ref 0–15)

## 2019-03-29 MED ORDER — VITAMIN B-12 1000 MCG PO TABS: 1000.0000 ug | ORAL_TABLET | Freq: Every day | ORAL | 1 refills | Status: DC

## 2019-03-29 MED ORDER — VITAMIN D (ERGOCALCIFEROL) 1.25 MG (50000 UNIT) PO CAPS: 50000.0000 [IU] | ORAL_CAPSULE | ORAL | 0 refills | Status: DC

## 2019-04-04 ENCOUNTER — Other Ambulatory Visit: Payer: BC Managed Care – PPO

## 2019-04-04 ENCOUNTER — Other Ambulatory Visit: Payer: Self-pay

## 2019-04-04 DIAGNOSIS — E291 Testicular hypofunction: Secondary | ICD-10-CM | POA: Diagnosis not present

## 2019-04-08 LAB — FSH/LH
FSH: 2.8 m[IU]/mL (ref 1.5–12.4)
LH: 6 m[IU]/mL (ref 1.7–8.6)

## 2019-04-08 LAB — TESTOSTERONE, FREE AND TOTAL (INCLUDES SHBG)-(MALES)
% Free Testosterone: 2.4 %
Free Testosterone, S: 67 pg/mL
Sex Hormone Binding Globulin: 19.4 nmol/L
Testosterone, Serum (Total): 278 ng/dL

## 2019-04-08 LAB — PSA: Prostate Specific Ag, Serum: 1 ng/mL (ref 0.0–4.0)

## 2019-04-10 ENCOUNTER — Encounter: Payer: Self-pay | Admitting: Family Medicine

## 2019-04-11 ENCOUNTER — Telehealth: Payer: Self-pay | Admitting: Internal Medicine

## 2019-04-11 NOTE — Telephone Encounter (Signed)
Pt would like to know what his recent labs showed

## 2019-04-12 ENCOUNTER — Encounter: Payer: Self-pay | Admitting: Family Medicine

## 2019-04-12 ENCOUNTER — Ambulatory Visit (INDEPENDENT_AMBULATORY_CARE_PROVIDER_SITE_OTHER): Payer: BC Managed Care – PPO | Admitting: Family Medicine

## 2019-04-12 ENCOUNTER — Other Ambulatory Visit: Payer: Self-pay

## 2019-04-12 VITALS — Wt 242.0 lb

## 2019-04-12 DIAGNOSIS — Z9989 Dependence on other enabling machines and devices: Secondary | ICD-10-CM

## 2019-04-12 DIAGNOSIS — E559 Vitamin D deficiency, unspecified: Secondary | ICD-10-CM

## 2019-04-12 DIAGNOSIS — G4733 Obstructive sleep apnea (adult) (pediatric): Secondary | ICD-10-CM | POA: Diagnosis not present

## 2019-04-12 DIAGNOSIS — E291 Testicular hypofunction: Secondary | ICD-10-CM | POA: Diagnosis not present

## 2019-04-12 DIAGNOSIS — E669 Obesity, unspecified: Secondary | ICD-10-CM

## 2019-04-12 MED ORDER — TESTOSTERONE 50 MG/5GM (1%) TD GEL: 5.0000 g | Freq: Every day | TRANSDERMAL | 1 refills | Status: DC

## 2019-04-12 NOTE — Telephone Encounter (Signed)
Left message for pt to call back  °

## 2019-04-12 NOTE — Progress Notes (Signed)
Subjective:   Documentation for virtual audio and video telecommunications through [employer] encounter:  The patient was located at work. 2 patient identifiers used.  The provider was located in the office. The patient did consent to this visit and is aware of possible charges through their insurance for this visit.  The other persons participating in this telemedicine service were none.     Patient ID: Roy Jackson, male    DOB: 1971-06-08, 48 y.o.   MRN: [id number]  HPI Chief Complaint  Patient presents with  . discuss    discuss testosterone and cpap   Follow up on abnormal labs and starting on testosterone replacement therapy. Mild OSA and hypogonadism.  States he started using the CPAP 3 weeks ago. Using this every night. Sleeping longer and not waking up as much. Feel as though he is benefiting but still has low energy and generalized weakness. Libido is decreased.   He has had 2 fairly low and low normal testosterone readings with a history of hypogonadism. States he has never treated this but now would like to start on testosterone replacement.   Low energy preventing him from exercising.   Recent Results (from the past 2160 hour(s))  CBC with Differential/Platelet     Status: Abnormal   Collection Time: 02/11/19 10:50 AM  Result Value Ref Range   WBC 3.8 3.4 - 10.8 x10E3/uL   RBC 4.51 4.14 - 5.80 x10E6/uL   Hemoglobin 14.8 13.0 - 17.7 g/dL   Hematocrit 40.940.5 81.137.5 - 51.0 %   MCV 90 79 - 97 fL   MCH 32.8 26.6 - 33.0 pg   MCHC 36.5 (H) 31.5 - 35.7 g/dL   RDW 91.413.0 78.211.6 - 95.615.4 %   Platelets 250 150 - 450 x10E3/uL   Neutrophils 59 Not Estab. %   Lymphs 34 Not Estab. %   Monocytes 7 Not Estab. %   Eos 0 Not Estab. %   Basos 0 Not Estab. %   Neutrophils Absolute 2.3 1.4 - 7.0 x10E3/uL   Lymphocytes Absolute 1.3 0.7 - 3.1 x10E3/uL   Monocytes Absolute 0.3 0.1 - 0.9 x10E3/uL   EOS (ABSOLUTE) 0.0 0.0 - 0.4 x10E3/uL   Basophils Absolute 0.0 0.0 - 0.2 x10E3/uL   Immature Granulocytes 0 Not Estab. %   Immature Grans (Abs) 0.0 0.0 - 0.1 x10E3/uL   Hematology Comments: Note:     Comment: Verified by microscopic examination.  Comprehensive metabolic panel     Status: None   Collection Time: 02/11/19 10:50 AM  Result Value Ref Range   Glucose 85 65 - 99 mg/dL   BUN 10 6 - 24 mg/dL   Creatinine, Ser 2.131.01 0.76 - 1.27 mg/dL   GFR calc non Af Amer 88 >59 mL/min/1.73   GFR calc Af Amer 102 >59 mL/min/1.73   BUN/Creatinine Ratio 10 9 - 20   Sodium 142 134 - 144 mmol/L   Potassium 4.4 3.5 - 5.2 mmol/L   Chloride 104 96 - 106 mmol/L   CO2 23 20 - 29 mmol/L   Calcium 9.5 8.7 - 10.2 mg/dL   Total Protein 7.4 6.0 - 8.5 g/dL   Albumin 4.4 4.0 - 5.0 g/dL   Globulin, Total 3.0 1.5 - 4.5 g/dL   Albumin/Globulin Ratio 1.5 1.2 - 2.2   Bilirubin Total 0.3 0.0 - 1.2 mg/dL   Alkaline Phosphatase 82 39 - 117 IU/L   AST 20 0 - 40 IU/L   ALT 31 0 - 44 IU/L  ANA  Status: None   Collection Time: 02/11/19 10:50 AM  Result Value Ref Range   Anti Nuclear Antibody (ANA) Negative Negative  TSH     Status: None   Collection Time: 02/11/19 10:50 AM  Result Value Ref Range   TSH 1.060 0.450 - 4.500 uIU/mL  T4, free     Status: None   Collection Time: 02/11/19 10:50 AM  Result Value Ref Range   Free T4 1.14 0.82 - 1.77 ng/dL  T3     Status: None   Collection Time: 02/11/19 10:50 AM  Result Value Ref Range   T3, Total 121 71 - 180 ng/dL  RPR     Status: None   Collection Time: 02/11/19 10:50 AM  Result Value Ref Range   RPR Ser Ql Non Reactive Non Reactive  HIV Antibody (routine testing w rflx)     Status: None   Collection Time: 02/11/19 10:50 AM  Result Value Ref Range   HIV Screen 4th Generation wRfx Non Reactive Non Reactive  VITAMIN D 25 Hydroxy (Vit-D Deficiency, Fractures)     Status: Abnormal   Collection Time: 02/11/19 10:50 AM  Result Value Ref Range   Vit D, 25-Hydroxy 17.1 (L) 30.0 - 100.0 ng/mL    Comment: Vitamin D deficiency has been defined  by the [place] and an Endocrine Society practice guideline as a level of serum 25-OH vitamin D less than 20 ng/mL (1,2). The Endocrine Society went on to further define vitamin D insufficiency as a level between 21 and 29 ng/mL (2). 1. IOM (Institute of Medicine). 2010. Dietary reference    intakes for calcium and D. [place]: The    [employer]. 2. Holick MF, Binkley Bolt, Bischoff-Ferrari HA, et al.    Evaluation, treatment, and prevention of vitamin D    deficiency: an Endocrine Society clinical practice    guideline. JCEM. 2011 Jul; 96(7):1911-30.   Vitamin B12     Status: None   Collection Time: 02/11/19 10:50 AM  Result Value Ref Range   Vitamin B-12 353 232 - 1,245 pg/mL  Sedimentation rate     Status: Abnormal   Collection Time: 02/11/19 10:50 AM  Result Value Ref Range   Sed Rate 33 (H) 0 - 15 mm/hr  Rheumatoid factor     Status: None   Collection Time: 02/11/19 10:50 AM  Result Value Ref Range   Rhuematoid fact SerPl-aCnc <10.0 0.0 - 13.9 IU/mL  POCT Urinalysis DIP (Proadvantage Device)     Status: Abnormal   Collection Time: 02/11/19 12:08 PM  Result Value Ref Range   Color, UA yellow yellow   Clarity, UA clear clear   Glucose, UA negative negative mg/dL   Bilirubin, UA negative negative   Ketones, POC UA negative negative mg/dL   Specific Gravity, Urine 1.020    Blood, UA negative negative   pH, UA 6.0 5.0 - 8.0   Protein Ur, POC trace (A) negative mg/dL   Urobilinogen, Ur n    Nitrite, UA Negative Negative   Leukocytes, UA Negative Negative  Testosterone     Status: Abnormal   Collection Time: 03/28/19 11:55 AM  Result Value Ref Range   Testosterone 241 (L) 264 - 916 ng/dL    Comment: Adult male reference interval is based on a population of healthy nonobese males (BMI <30) between 70 and 55 years old. [place], [place] [phone]. PMID: [id number].   VITAMIN D 25 Hydroxy (Vit-D Deficiency, Fractures)     Status:  Abnormal   Collection Time: 03/28/19 11:55 AM  Result Value Ref Range   Vit D, 25-Hydroxy 21.7 (L) 30.0 - 100.0 ng/mL    Comment: Vitamin D deficiency has been defined by the Institute of Medicine and an Endocrine Society practice guideline as a level of serum 25-OH vitamin D less than 20 ng/mL (1,2). The Endocrine Society went on to further define vitamin D insufficiency as a level between 21 and 29 ng/mL (2). 1. IOM (Institute of Medicine). 2010. Dietary reference    intakes for calcium and D. [place]: The    [employer]. 2. Holick MF, Binkley Norco, Bischoff-Ferrari HA, et al.    Evaluation, treatment, and prevention of vitamin D    deficiency: an Endocrine Society clinical practice    guideline. JCEM. 2011 Jul; 96(7):1911-30.   Vitamin B12     Status: None   Collection Time: 03/28/19 11:55 AM  Result Value Ref Range   Vitamin B-12 382 232 - 1,245 pg/mL  Sedimentation rate     Status: None   Collection Time: 03/28/19 11:55 AM  Result Value Ref Range   Sed Rate 11 0 - 15 mm/hr  FSH/LH     Status: None   Collection Time: 04/04/19  8:27 AM  Result Value Ref Range   LH 6.0 1.7 - 8.6 mIU/mL   FSH 2.8 1.5 - 12.4 mIU/mL  PSA     Status: None   Collection Time: 04/04/19  8:27 AM  Result Value Ref Range   Prostate Specific Ag, Serum 1.0 0.0 - 4.0 ng/mL    Comment: Roche ECLIA methodology. According to the American Urological Association, Serum PSA should decrease and remain at undetectable levels after radical prostatectomy. The AUA defines biochemical recurrence as an initial PSA value 0.2 ng/mL or greater followed by a subsequent confirmatory PSA value 0.2 ng/mL or greater. Values obtained with different assay methods or kits cannot be used interchangeably. Results cannot be interpreted as absolute evidence of the presence or absence of malignant disease.   Testosterone Free with SHBG     Status: None   Collection Time: 04/04/19  8:27 AM  Result Value Ref  Range   Testosterone, Serum (Total) 278 ng/dL    Comment: This test was developed and its performance characteristics determined by [facility]. It has not been cleared or approved by the Food and Drug Administration. Reference Range: Adult Males >18 years    [address] - 916 This [facility] LC/MS-MS method is currently certified by the [facility] Hormone Standardization Program (HoST).  Adult male reference interval is based on a population of healthy nonobese males (BMI <30) between 2019 and 47 years old. Mardee Postinravison, et.al. JCEM 8119,147;[id number],102;1161-1173 PMID: [id number].    % Free Testosterone 2.4 %    Comment: This test was developed and its performance characteristics determined by [facility]. It has not been cleared or approved by the Food and Drug Administration. Reference Range: Adult Males: 1.5 - 3.2    Free Testosterone, S 67 pg/mL    Comment: Reference Range: Adult Males: 5152 - 280    Sex Hormone Binding Globulin 19.4 nmol/L    Comment: Reference Range: Pubertal: 16.0 - 100.0 20 - 49y: 16.5 - 55.9 >49y:     19.3 - 76.4      Denies fever, chills, dizziness, chest pain, palpitations, shortness of breath, abdominal pain, N/V/D, urinary symptoms.   Reviewed allergies, medications, past medical, surgical, family, and social history.   Review of Systems Pertinent positives and negatives in the history  of present illness.     Objective:   Physical Exam Wt 242 lb (109.8 kg)   BMI 35.74 kg/m   Alert and oriented and in no acute distress.  Respirations unlabored.  Normal speech, mood and thought process.  Unable to further examine.      Assessment & Plan:  Hypogonadism in male - Plan: start testosterone replacement. Discussed administration and safety measures such as avoiding children, women or pets from coming in contact with medication. Also discussed potential side effects. Advised weight loss would help improve testosterone level and we could potentially have him stop this in the future if he  loses weight. He will send me a message or call in 2 weeks to let me know how he is doing and follow up in office in 4 weeks. Recheck testosterone and CBC at that point.   OSA on CPAP - Plan: he appears to be benefiting from CPAP. Started 3 weeks ago.  Continue using CPAP. Get printout before follow up.   Vitamin D deficiency - Plan: continue supplement and recheck vitamin D level at follow up.   Obesity (BMI 30-39.9) - Plan: discussed eating healthy and increasing physical activity.

## 2019-04-12 NOTE — Telephone Encounter (Signed)
Let's do a virtual visit so that I can talk to him about starting on testosterone.

## 2019-04-14 ENCOUNTER — Telehealth: Payer: Self-pay | Admitting: Family Medicine

## 2019-04-14 DIAGNOSIS — E291 Testicular hypofunction: Secondary | ICD-10-CM

## 2019-04-14 NOTE — Telephone Encounter (Signed)
Pt informed

## 2019-04-27 DIAGNOSIS — G4733 Obstructive sleep apnea (adult) (pediatric): Secondary | ICD-10-CM | POA: Diagnosis not present

## 2019-04-27 NOTE — Telephone Encounter (Signed)
P.A. was denied stating pt needs documentation of normal Prolactin level.  Do you want to bring pt in for this test?

## 2019-04-27 NOTE — Telephone Encounter (Signed)
Ok to bring him in for this.

## 2019-04-27 NOTE — Telephone Encounter (Signed)
Pt coming in tuesday

## 2019-05-03 ENCOUNTER — Other Ambulatory Visit: Payer: Self-pay

## 2019-05-03 ENCOUNTER — Other Ambulatory Visit: Payer: BC Managed Care – PPO

## 2019-05-03 DIAGNOSIS — E291 Testicular hypofunction: Secondary | ICD-10-CM

## 2019-05-04 LAB — PROLACTIN: Prolactin: 8.9 ng/mL (ref 4.0–15.2)

## 2019-05-11 ENCOUNTER — Telehealth: Payer: Self-pay | Admitting: Family Medicine

## 2019-05-11 NOTE — Telephone Encounter (Signed)
P.A. TESTOSTERONE resent with normal Prolactin labs

## 2019-05-13 NOTE — Telephone Encounter (Signed)
See other telephone call for P.A.

## 2019-05-13 NOTE — Telephone Encounter (Signed)
P.A. approved til 10/05/38, pt informed, faxed pharmacy

## 2019-05-23 DIAGNOSIS — F329 Major depressive disorder, single episode, unspecified: Secondary | ICD-10-CM | POA: Diagnosis not present

## 2019-05-28 DIAGNOSIS — G4733 Obstructive sleep apnea (adult) (pediatric): Secondary | ICD-10-CM | POA: Diagnosis not present

## 2019-06-02 DIAGNOSIS — F329 Major depressive disorder, single episode, unspecified: Secondary | ICD-10-CM | POA: Diagnosis not present

## 2019-06-09 DIAGNOSIS — F329 Major depressive disorder, single episode, unspecified: Secondary | ICD-10-CM | POA: Diagnosis not present

## 2019-06-14 DIAGNOSIS — F329 Major depressive disorder, single episode, unspecified: Secondary | ICD-10-CM | POA: Diagnosis not present

## 2019-06-16 ENCOUNTER — Encounter: Payer: Self-pay | Admitting: Family Medicine

## 2019-06-21 DIAGNOSIS — F329 Major depressive disorder, single episode, unspecified: Secondary | ICD-10-CM | POA: Diagnosis not present

## 2019-06-28 ENCOUNTER — Ambulatory Visit (INDEPENDENT_AMBULATORY_CARE_PROVIDER_SITE_OTHER): Payer: BC Managed Care – PPO | Admitting: Family Medicine

## 2019-06-28 ENCOUNTER — Other Ambulatory Visit: Payer: Self-pay

## 2019-06-28 ENCOUNTER — Encounter: Payer: Self-pay | Admitting: Family Medicine

## 2019-06-28 VITALS — BP 166/92 | HR 82 | Temp 97.8°F | Wt 250.4 lb

## 2019-06-28 DIAGNOSIS — R51 Headache: Secondary | ICD-10-CM

## 2019-06-28 DIAGNOSIS — G4440 Drug-induced headache, not elsewhere classified, not intractable: Secondary | ICD-10-CM

## 2019-06-28 DIAGNOSIS — Z9989 Dependence on other enabling machines and devices: Secondary | ICD-10-CM

## 2019-06-28 DIAGNOSIS — R519 Headache, unspecified: Secondary | ICD-10-CM

## 2019-06-28 DIAGNOSIS — G4733 Obstructive sleep apnea (adult) (pediatric): Secondary | ICD-10-CM | POA: Diagnosis not present

## 2019-06-28 DIAGNOSIS — E559 Vitamin D deficiency, unspecified: Secondary | ICD-10-CM | POA: Diagnosis not present

## 2019-06-28 DIAGNOSIS — F329 Major depressive disorder, single episode, unspecified: Secondary | ICD-10-CM | POA: Diagnosis not present

## 2019-06-28 NOTE — Patient Instructions (Signed)
Cut back to 2 Aleve twice per day and you can take 2 Tylenol 4 times per day as needed

## 2019-06-28 NOTE — Progress Notes (Signed)
   Subjective:    Patient ID: Roy Jackson, male    DOB: 27-Feb-1971, 48 y.o.   MRN: [id number]  HPI He is here for consult concerning headache.  He states that he has had a daily headache for the last 6 to 8 years.  It is sometimes associated with nausea and dizziness.  He is also noted occasional blurred vision and what he describes as circles in his left eye when his headache gets really bad.  He describes it as in the occipital area moving forward to the forehead and behind the left eye.  He also describes left arm numbness when the pain gets really bad.  No double vision or arm weakness.  He takes up to 8 Aleve daily for control of this and also uses cold compresses.  He has apparently been seen in an emergency room for this but has never gotten follow-up until now. Review of his record also indicates he has a vitamin D deficiency and recently finished his dosing regimen.  He is also taking testosterone and has OSA. Review of Systems     Objective:   Physical Exam Alert and in no distress.  EOMI.  Other cranial nerves grossly intact.  DTRs are 1+.  No carotid bruits noted. no motor weakness noted.  Tympanic membranes and canals are normal. Pharyngeal area is normal. Neck is supple without adenopathy or thyromegaly. Cardiac exam shows a regular sinus rhythm without murmurs or gallops. Lungs are clear to auscultation.       Assessment & Plan:  Chronic intractable headache, unspecified headache type  Vitamin D deficiency - Plan: VITAMIN D 25 Hydroxy (Vit-D Deficiency, Fractures)  Medication overuse headache - Plan: Ambulatory referral to Neurology  OSA on CPAP He is to cut back on his Aleve to 2 twice per day and also use Tylenol.  Refer to neurology for more definitive care.

## 2019-06-29 LAB — VITAMIN D 25 HYDROXY (VIT D DEFICIENCY, FRACTURES): Vit D, 25-Hydroxy: 33.8 ng/mL (ref 30.0–100.0)

## 2019-07-05 ENCOUNTER — Encounter: Payer: Self-pay | Admitting: Neurology

## 2019-07-06 NOTE — Progress Notes (Signed)
Virtual Visit via Video Note The purpose of this virtual visit is to provide medical care while limiting exposure to the novel coronavirus.    Consent was obtained for video visit:  Yes.   Answered questions that patient had about telehealth interaction:  Yes.   I discussed the limitations, risks, security and privacy concerns of performing an evaluation and management service by telemedicine. I also discussed with the patient that there may be a patient responsible charge related to this service. The patient expressed understanding and agreed to proceed.  Pt location: Home Physician Location: Home Name of referring provider:  Girtha Rm, NP-C I connected with Roy Jackson at patients initiation/request on 07/07/2019 at  9:10 AM EDT by video enabled telemedicine application and verified that I am speaking with the correct person using two identifiers. Pt MRN:  [id number] Pt DOB:  1970/11/14 Video Participants:  Roy Jackson   History of Present Illness:  Roy Jackson is a 48 year old male who presents for headaches.  History supplemented by referring provider note.  Onset:  Since childhood, but became more severe and daily since age 44. Location:  Starts in back of head and radiates to forehead and behind left eye Quality: usually throbbing/pressure but stabbing when severe  Intensity:  Mild to severe.  He denies new headache, thunderclap headache or severe headache that wakes him from sleep. Aura:  no Associated symptoms:  Sometimes nausea, photophobia, phonophobia, dizziness, blurred vision.  When severe, there may be associated visual disturbance (circles in vision of left eye) and left arm numbness and shooting pain down into the two middle fingers.  He denies associated slurred speech or weakness. Duration:  constant Frequency:  Constant Fluctuates in intensity, severe once or twice a month lasting 15 to 120 minutes in which he cannot function.   Frequency of  abortive medication: Aleve or ibuprofen daily for several years.   Triggers:  unknown Relieving factors:  Cold compresses Activity:  Can't function when severe Always with neck soreness.  Sed rate from 02/11/2019 was 33 but subsequent lab from 03/28/2019 was 11.   Remote head CT from 12/17/2011 personally reviewed and demonstrated pansinusitis (presenting symptoms) but normal looking brain.  Current NSAIDS:  Aleve, ibuprofen Current analgesics:  Tylenol Current triptans:  none Current ergotamine:  none Current anti-emetic:  none Current muscle relaxants:  none Current anti-anxiolytic:  none Current sleep aide:  none Current Antihypertensive medications:  none Current Antidepressant medications:  none Current Anticonvulsant medications:  none Current anti-CGRP:  none Current Vitamins/Herbal/Supplements:  B12, D Current Antihistamines/Decongestants:  none Other therapy:  Cold compresses Hormone/birth control:  testosterone  Past NSAIDS:  none Past analgesics:  tramadol Past abortive triptans:  none Past abortive ergotamine:  none Past muscle relaxants:  tizanidine Past anti-emetic:  none Past antihypertensive medications:  none Past antidepressant medications:  none Past anticonvulsant medications:  none Past anti-CGRP:  none Past vitamins/Herbal/Supplements:  none Past antihistamines/decongestants:  none Other past therapies:  none  Caffeine:  Coffee every once in a while.   Smoker:  1/2 pack per week Diet:  Tries to drink 5-7 bottles of 16 oz water daily, 1-2 Sprites a day Exercise:  Not recently Depression:  maybe; Anxiety: once in awhile Other pain:  Joint pain Sleep hygiene:  Poor.  He has OSA on CPAP but not helping. Family history of headache:  No   Past Medical History: Past Medical History:  Diagnosis Date  . Chronic headaches   . History  of hypogonadism 03/28/2019  . OSA (obstructive sleep apnea) 11/12/2017   Per sleep study 10/2017     Medications: Outpatient Encounter Medications as of 07/07/2019  Medication Sig  . naproxen sodium (ALEVE) 220 MG tablet Take 220 mg by mouth daily as needed.  . testosterone (ANDROGEL) 50 MG/5GM (1%) GEL Place 5 g onto the skin daily.  . vitamin B-12 (CYANOCOBALAMIN) 1000 MCG tablet Take 1 tablet (1,000 mcg total) by mouth daily.  . Vitamin D, Ergocalciferol, (DRISDOL) 1.25 MG (50000 UT) CAPS capsule Take 1 capsule (50,000 Units total) by mouth every 7 (seven) days.   No facility-administered encounter medications on file as of 07/07/2019.     Allergies: Allergies  Allergen Reactions  . Darvocet [Propoxyphene N-Acetaminophen] Other (See Comments)    unknown  . Penicillins Swelling  . Sulfa Antibiotics Other (See Comments)    unkown    Family History: Family History  Problem Relation Age of Onset  . Heart attack Other        maternal GF died in 4550s, maternal uncles x 2 in 7440s   . Hypertension Paternal Grandmother   . Hypertension Paternal Grandfather   . Diabetes Paternal Grandfather     Social History: Social History   Socioeconomic History  . Marital status: Married    Spouse name: Not on file  . Number of children: Not on file  . Years of education: Not on file  . Highest education level: Not on file  Occupational History  . Not on file  Social Needs  . Financial resource strain: Not on file  . Food insecurity    Worry: Not on file    Inability: Not on file  . Transportation needs    Medical: Not on file    Non-medical: Not on file  Tobacco Use  . Smoking status: Current Some Day Smoker  . Smokeless tobacco: Never Used  . Tobacco comment: last one 2-3 months ago  Substance and Sexual Activity  . Alcohol use: Not Currently    Comment: occ  . Drug use: No  . Sexual activity: Not on file  Lifestyle  . Physical activity    Days per week: Not on file    Minutes per session: Not on file  . Stress: Not on file  Relationships  . Social Wellsite geologistconnections     Talks on phone: Not on file    Gets together: Not on file    Attends religious service: Not on file    Active member of club or organization: Not on file    Attends meetings of clubs or organizations: Not on file    Relationship status: Not on file  . Intimate partner violence    Fear of current or ex partner: Not on file    Emotionally abused: Not on file    Physically abused: Not on file    Forced sexual activity: Not on file  Other Topics Concern  . Not on file  Social History Narrative   Smoking 1/2 pk per week    leave wife   2-story home   Soda 1-2 a day   11th grade     Observations/Objective:   Height 5\' 8"  (1.727 m), weight 248 lb (112.5 kg). No acute distress.  Alert and oriented.  Speech fluent and not dysarthric.  Language intact.  Eyes orthophoric on primary gaze.  Face symmetric.  Assessment and Plan:   1.  Chronic migraine with and without aura, without status migrainosus, not intractable.  Complicated  by medication overuse.  Also aggravated by probable cervical radiculopathy 2.  Cervicalgia with left sided radiculopathy, possibly C7  1.  For preventative management, start nortriptyline 25mg  at bedtime.  We can increase dose to 50mg  at bedtime in 4 weeks if needed. 2.  For abortive therapy, rizatriptan 10mg . 3.  STOP NSAIDs 4. Limit use of pain relievers to no more than 2 days out of week to prevent risk of rebound or medication-overuse headache. 5. For chronic neck pain with radiculopathy, will refer to Dr. of Sports Medicine 6.  Keep headache diary 7.  Exercise, hydration, caffeine cessation, sleep hygiene, monitor for and avoid triggers 8.  Follow up 4 months.   Follow Up Instructions:    -I discussed the assessment and treatment plan with the patient. The patient was provided an opportunity to ask questions and all were answered. The patient agreed with the plan and demonstrated an understanding of the instructions.   The patient was  advised to call back or seek an in-person evaluation if the symptoms worsen or if the condition fails to improve as anticipated.   , DO

## 2019-07-07 ENCOUNTER — Telehealth (INDEPENDENT_AMBULATORY_CARE_PROVIDER_SITE_OTHER): Payer: Self-pay | Admitting: Neurology

## 2019-07-07 ENCOUNTER — Encounter: Payer: Self-pay | Admitting: Neurology

## 2019-07-07 ENCOUNTER — Other Ambulatory Visit: Payer: Self-pay | Admitting: *Deleted

## 2019-07-07 ENCOUNTER — Other Ambulatory Visit: Payer: Self-pay

## 2019-07-07 VITALS — Ht 68.0 in | Wt 248.0 lb

## 2019-07-07 DIAGNOSIS — M5412 Radiculopathy, cervical region: Secondary | ICD-10-CM

## 2019-07-07 DIAGNOSIS — G43709 Chronic migraine without aura, not intractable, without status migrainosus: Secondary | ICD-10-CM

## 2019-07-07 DIAGNOSIS — F329 Major depressive disorder, single episode, unspecified: Secondary | ICD-10-CM | POA: Diagnosis not present

## 2019-07-07 DIAGNOSIS — G43109 Migraine with aura, not intractable, without status migrainosus: Secondary | ICD-10-CM

## 2019-07-07 MED ORDER — NORTRIPTYLINE HCL 25 MG PO CAPS: 25.0000 mg | ORAL_CAPSULE | Freq: Every day | ORAL | 3 refills | Status: DC

## 2019-07-07 MED ORDER — RIZATRIPTAN BENZOATE 10 MG PO TABS: ORAL_TABLET | ORAL | 3 refills | Status: DC

## 2019-07-07 NOTE — Patient Instructions (Signed)
1. Start nortriptyline 25mg  at bedtime.  Contact me in 4 weeks with update and we can increase dose if needed. 2.  When you get a severe headache, take rizatriptan 10mg .  May repeat once after 2 hours if needed.  No more than 2 tablets in 24 hours.  Take no more than 2 days a week. 3.  STOP ibuprofen, aleve and all over the counter pain relievers 4. Limit use of pain relievers to no more than 2 days out of week to prevent risk of rebound or medication-overuse headache. 5. For chronic neck pain with radiculopathy, will refer to Dr. Hulan Saas of Sports Medicine 6.  Keep headache diary 7.  Exercise, hydration, caffeine cessation, sleep hygiene, monitor for and avoid triggers 8.  Follow up 4 months.

## 2019-07-12 DIAGNOSIS — F329 Major depressive disorder, single episode, unspecified: Secondary | ICD-10-CM | POA: Diagnosis not present

## 2019-07-21 DIAGNOSIS — F329 Major depressive disorder, single episode, unspecified: Secondary | ICD-10-CM | POA: Diagnosis not present

## 2019-07-22 ENCOUNTER — Encounter: Payer: Self-pay | Admitting: Family Medicine

## 2019-07-27 DIAGNOSIS — F329 Major depressive disorder, single episode, unspecified: Secondary | ICD-10-CM | POA: Diagnosis not present

## 2019-07-28 DIAGNOSIS — G4733 Obstructive sleep apnea (adult) (pediatric): Secondary | ICD-10-CM | POA: Diagnosis not present

## 2019-08-02 ENCOUNTER — Encounter: Payer: Self-pay | Admitting: Family Medicine

## 2019-08-09 DIAGNOSIS — F329 Major depressive disorder, single episode, unspecified: Secondary | ICD-10-CM | POA: Diagnosis not present

## 2019-08-15 ENCOUNTER — Ambulatory Visit: Payer: BC Managed Care – PPO | Admitting: Family Medicine

## 2019-08-16 ENCOUNTER — Encounter: Payer: Self-pay | Admitting: Family Medicine

## 2019-08-21 DIAGNOSIS — F329 Major depressive disorder, single episode, unspecified: Secondary | ICD-10-CM | POA: Diagnosis not present

## 2019-08-28 DIAGNOSIS — G4733 Obstructive sleep apnea (adult) (pediatric): Secondary | ICD-10-CM | POA: Diagnosis not present

## 2019-09-02 ENCOUNTER — Other Ambulatory Visit: Payer: Self-pay

## 2019-09-02 ENCOUNTER — Emergency Department
Admission: EM | Admit: 2019-09-02 | Discharge: 2019-09-03 | Disposition: A | Discharge status: Home / Self Care | Payer: Self-pay | Attending: Emergency Medicine | Admitting: Emergency Medicine

## 2019-09-02 ENCOUNTER — Emergency Department: Payer: Self-pay

## 2019-09-02 DIAGNOSIS — Z79899 Other long term (current) drug therapy: Secondary | ICD-10-CM | POA: Insufficient documentation

## 2019-09-02 DIAGNOSIS — F17210 Nicotine dependence, cigarettes, uncomplicated: Secondary | ICD-10-CM | POA: Insufficient documentation

## 2019-09-02 DIAGNOSIS — R7989 Other specified abnormal findings of blood chemistry: Secondary | ICD-10-CM | POA: Insufficient documentation

## 2019-09-02 DIAGNOSIS — R079 Chest pain, unspecified: Secondary | ICD-10-CM | POA: Insufficient documentation

## 2019-09-02 LAB — BASIC METABOLIC PANEL
Anion gap: 7 (ref 5–15)
BUN: 11 mg/dL (ref 6–20)
CO2: 27 mmol/L (ref 22–32)
Calcium: 8.9 mg/dL (ref 8.9–10.3)
Chloride: 104 mmol/L (ref 98–111)
Creatinine, Ser: 1.02 mg/dL (ref 0.61–1.24)
GFR calc Af Amer: >60 mL/min (ref >60)
GFR calc non Af Amer: >60 mL/min (ref >60)
Glucose, Bld: 94 mg/dL (ref 70–99)
Potassium: 3.5 mmol/L (ref 3.5–5.1)
Sodium: 138 mmol/L (ref 135–145)

## 2019-09-02 LAB — CBC
HCT: 42 % (ref 39.0–52.0)
Hemoglobin: 13.8 g/dL (ref 13.0–17.0)
MCH: 29.9 pg (ref 26.0–34.0)
MCHC: 32.9 g/dL (ref 30.0–36.0)
MCV: 90.9 fL (ref 80.0–100.0)
Platelets: 323 10*3/uL (ref 150–400)
RBC: 4.62 MIL/uL (ref 4.22–5.81)
RDW: 12.9 % (ref 11.5–15.5)
WBC: 6.4 10*3/uL (ref 4.0–10.5)
nRBC: 0 % (ref 0.0–0.2)

## 2019-09-02 LAB — TROPONIN I (HIGH SENSITIVITY): Troponin I (High Sensitivity): 4 ng/L (ref <18)

## 2019-09-02 NOTE — ED Triage Notes (Signed)
Patient c/o left chest pain radiating to back X 1 week. Patient accompanying symptoms of intermittent SOB and lightheadness.

## 2019-09-03 ENCOUNTER — Emergency Department: Payer: Self-pay

## 2019-09-03 LAB — HEPATIC FUNCTION PANEL
ALT: 90 U/L — ABNORMAL HIGH (ref 0–44)
AST: 66 U/L — ABNORMAL HIGH (ref 15–41)
Albumin: 3.9 g/dL (ref 3.5–5.0)
Alkaline Phosphatase: 107 U/L (ref 38–126)
Bilirubin, Direct: <0.1 mg/dL (ref 0.0–0.2)
Total Bilirubin: 0.6 mg/dL (ref 0.3–1.2)
Total Protein: 8.3 g/dL — ABNORMAL HIGH (ref 6.5–8.1)

## 2019-09-03 LAB — TROPONIN I (HIGH SENSITIVITY)
Troponin I (High Sensitivity): 4 ng/L (ref <18)
Troponin I (High Sensitivity): 4 ng/L (ref <18)

## 2019-09-03 LAB — FIBRIN DERIVATIVES D-DIMER (ARMC ONLY): Fibrin derivatives D-dimer (ARMC): 870.94 ng/mL (FEU) — ABNORMAL HIGH (ref 0.00–499.00)

## 2019-09-03 LAB — LIPASE, BLOOD: Lipase: 23 U/L (ref 11–51)

## 2019-09-03 MED ORDER — ASPIRIN 81 MG PO CHEW
324.0000 mg | CHEWABLE_TABLET | Freq: Once | ORAL | Status: AC
  Administered 2019-09-03: 324 mg via ORAL
  Filled 2019-09-03: qty 4

## 2019-09-03 MED ORDER — IOHEXOL 350 MG/ML SOLN
100.0000 mL | Freq: Once | INTRAVENOUS | Status: AC | PRN
  Administered 2019-09-03: 02:00:00 100 mL via INTRAVENOUS

## 2019-09-03 NOTE — ED Provider Notes (Signed)
[facility] Emergency Department Provider Note  ____________________________________________  Time seen: Approximately 12:12 AM  I have reviewed the triage vital signs and the nursing notes.   HISTORY  Chief Complaint Chest Pain   HPI Roy Jackson is a 48 y.o. male with a history of hypogonadism, OSA not on CPAP, chronic migraine headaches who presents for evaluation of chest pain.  Patient reports that he has had constant dull chest and upper back pain for the last week with intermittent stabbing component to the usually last up to an hour several times a day.  The pain is worse with palpation of the back and chest.  He has had a mild dry cough.  He reports that when the sharp pain is happening he has a little bit of trouble breathing because of the pain but he has no shortness of breath with the dull pain.  No nausea, vomiting, abdominal pain, fever or chills, loss of taste or smell, body aches.  No known exposures to Covid.  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  He was placed on testosterone for hypogonadism back in July however has run out of it 3 weeks ago.  He denies any personal or family history of blood clots, recent travel immobilization, leg pain or swelling, hemoptysis.  He denies any paresthesias of his extremities.   Past Medical History:  Diagnosis Date   Chronic headaches    History of hypogonadism 03/28/2019   OSA (obstructive sleep apnea) 11/12/2017   Per sleep study 10/2017    Patient Active Problem List   Diagnosis Date Noted   Hypogonadism in male 04/12/2019   Obesity (BMI 30-39.9) 04/12/2019   Vitamin D deficiency 03/28/2019   Chronic intractable headache 03/28/2019   History of hypogonadism 03/28/2019   Decreased libido 03/28/2019   Bilateral hand swelling 02/11/2019   Excessive daytime sleepiness 02/11/2019   Fatigue 02/11/2019   Paresthesia of hand, bilateral 02/11/2019    Paresthesia of right leg 02/11/2019   OSA on CPAP 11/12/2017   Chronic headaches    Tobacco abuse 06/10/2014   Unstable angina ([place]) 06/09/2014   Chest pain 06/09/2014    Past Surgical History:  Procedure Laterality Date   HERNIA REPAIR     TONSILLECTOMY      Prior to Admission medications   Medication Sig Start Date End Date Taking? Authorizing Provider  naproxen sodium (ALEVE) 220 MG tablet Take 220 mg by mouth daily as needed.    [provider]  nortriptyline (PAMELOR) 25 MG capsule Take 1 capsule (25 mg total) by mouth at bedtime. 07/07/19   Pieter Partridge, DO  rizatriptan (MAXALT) 10 MG tablet Take 1 tablet earliest onset of headache.  May repeat in 2 hours if needed.  Maximum 2 tablets in 24 hours 07/07/19   Pieter Partridge, DO  testosterone (ANDROGEL) 50 MG/5GM (1%) GEL Place 5 g onto the skin daily. 04/12/19   Henson, Vickie L, NP-C  vitamin B-12 (CYANOCOBALAMIN) 1000 MCG tablet Take 1 tablet (1,000 mcg total) by mouth daily. 03/29/19   Henson, Vickie L, NP-C  Vitamin D, Ergocalciferol, (DRISDOL) 1.25 MG (50000 UT) CAPS capsule Take 1 capsule (50,000 Units total) by mouth every 7 (seven) days. 03/29/19   Henson, Vickie L, NP-C    Allergies Darvocet [propoxyphene n-acetaminophen], Penicillins, and Sulfa antibiotics  Family History  Problem Relation Age of Onset   Heart attack Other        maternal GF  died in 4s, maternal uncles x 2 in 96s    Hypertension Paternal Grandmother    Hypertension Paternal Grandfather    Diabetes Paternal Grandfather     Social History Social History   Tobacco Use   Smoking status: Current Some Day Smoker   Smokeless tobacco: Never Used   Tobacco comment: last one 2-3 months ago  Substance Use Topics   Alcohol use: Not Currently    Comment: occ   Drug use: No    Review of Systems  Constitutional: Negative for fever. Eyes: Negative for visual changes. ENT: Negative for sore throat. Neck: No neck pain    Cardiovascular: + chest pain. Respiratory: Negative for shortness of breath. Gastrointestinal: Negative for abdominal pain, vomiting or diarrhea. Genitourinary: Negative for dysuria. Musculoskeletal: + upper back pain. Skin: Negative for rash. Neurological: Negative for headaches, weakness or numbness. Psych: No SI or HI  ____________________________________________   PHYSICAL EXAM:  VITAL SIGNS: ED Triage Vitals  Enc Vitals Group     BP 09/02/19 2035 136/87     Pulse Rate 09/02/19 2035 62     Resp 09/02/19 2035 17     Temp 09/02/19 2035 99.1 F (37.3 C)     Temp src --      SpO2 09/02/19 2035 99 %     Weight 09/02/19 2032 240 lb (108.9 kg)     Height 09/02/19 2032 5\' 8"  (1.727 m)     Head Circumference --      Peak Flow --      Pain Score 09/02/19 2031 5     Pain Loc --      Pain Edu? --      Excl. in GC? --     Constitutional: Alert and oriented. Well appearing and in no apparent distress. HEENT:      Head: Normocephalic and atraumatic.         Eyes: Conjunctivae are normal. Sclera is non-icteric.       Mouth/Throat: Mucous membranes are moist.       Neck: Supple with no signs of meningismus. Cardiovascular: Regular rate and rhythm. No murmurs, gallops, or rubs. 2+ symmetrical distal pulses are present in all extremities. No JVD. Respiratory: Normal respiratory effort. Lungs are clear to auscultation bilaterally. No wheezes, crackles, or rhonchi.  Gastrointestinal: Soft, non tender, and non distended with positive bowel sounds. No rebound or guarding. Musculoskeletal: Palpation of the chest wall and upper back muscles reproduce pain.  Nontender with normal range of motion in all extremities. No edema, cyanosis, or erythema of extremities. Neurologic: Normal speech and language. Face is symmetric. Moving all extremities. No gross focal neurologic deficits are appreciated. Skin: Skin is warm, dry and intact. No rash noted. Psychiatric: Mood and affect are normal.  Speech and behavior are normal.  ____________________________________________   LABS (all labs ordered are listed, but only abnormal results are displayed)  Labs Reviewed  HEPATIC FUNCTION PANEL - Abnormal; Notable for the following components:      Result Value   Total Protein 8.3 (*)    AST 66 (*)    ALT 90 (*)    All other components within normal limits  FIBRIN DERIVATIVES D-DIMER ([facility] ONLY) - Abnormal; Notable for the following components:   Fibrin derivatives D-dimer ([facility]) 870.94 (*)    All other components within normal limits  BASIC METABOLIC PANEL  CBC  LIPASE, BLOOD  TROPONIN I (HIGH SENSITIVITY)  TROPONIN I (HIGH SENSITIVITY)  TROPONIN I (HIGH SENSITIVITY)   ____________________________________________  EKG  ED ECG REPORT I, Nita Sicklearolina Juelz Claar, the attending physician, personally viewed and interpreted this ECG.  Normal sinus rhythm, rate of 61, normal intervals, normal axis, no ST elevations or depressions. S1Q3T3 is seen on EKG but present on prior EKG from 2019 ____________________________________________  RADIOLOGY  I have personally reviewed the images performed during this visit and I agree with the Radiologist's read.   Interpretation by Radiologist:  Dg Chest 2 View  Result Date: 09/02/2019 CLINICAL DATA:  Left chest pain, shortness of breath EXAM: CHEST - 2 VIEW COMPARISON:  06/09/2014 FINDINGS: Low lung volumes which accentuates heart size, likely upper limits normal. No confluent opacities or effusions. No acute bony abnormality. IMPRESSION: No active cardiopulmonary disease. Electronically Signed   By: Charlett NoseKevin  [facility] M.D.   On: 09/02/2019 20:55   Ct Angio Chest Aorta W/cm & Or Wo/cm  Result Date: 09/03/2019 CLINICAL DATA:  Chest pain EXAM: CT ANGIOGRAPHY CHEST WITH CONTRAST TECHNIQUE: Multidetector CT imaging of the chest was performed using the standard protocol during bolus administration of intravenous contrast. Multiplanar CT image  reconstructions and MIPs were obtained to evaluate the vascular anatomy. CONTRAST:  100mL OMNIPAQUE IOHEXOL 350 MG/ML SOLN COMPARISON:  None. FINDINGS: Cardiovascular: Preferential opacification of the thoracic aorta. No evidence of thoracic aortic aneurysm or dissection. Mild cardiomegaly. No pericardial effusion. Central pulmonary arteries are patent. Mediastinum/Nodes: No enlarged mediastinal, hilar, or axillary lymph nodes. Thyroid gland, trachea, and esophagus demonstrate no significant findings. Lungs/Pleura: Lungs are clear. No pleural effusion or pneumothorax. Upper Abdomen: No acute abnormality. Musculoskeletal: No chest wall abnormality. No acute or significant osseous findings. Review of the MIP images confirms the above findings. IMPRESSION: 1. No acute aortic syndrome or other acute thoracic abnormality. 2. Mild cardiomegaly. Electronically Signed   By: Deatra RobinsonKevin  Herman M.D.   On: 09/03/2019 02:46     ____________________________________________   PROCEDURES  Procedure(s) performed: None Procedures Critical Care performed:  None ____________________________________________   INITIAL IMPRESSION / ASSESSMENT AND PLAN / ED COURSE  48 y.o. male with a history of hypogonadism, OSA not on CPAP, chronic migraine headaches who presents for evaluation of 1 week of constant dull chest pain with intermittent sharp component worse with movement and palpation of the chest and upper back.  Patient is extremely well-appearing in no distress with normal vitals, normal work of breathing, normal sats, lungs are clear to auscultation, heart regular rate and rhythm with no murmurs, no rashes seen, palpation of the chest and upper back reproduce the pain, also movement of the torso reproduces the pain.  EKG showing no dysrhythmias or ischemic changes.  EKG does show S1Q3T3 however that is seen on prior EKG from April 2019.  Since patient was temporarily on testosterone supplementation a D-dimer will be sent to  rule out PE.  Differential diagnosis including musculoskeletal pain, costochondritis, PE, pneumonia, Covid, pneumothorax.  Atypical for ACS however will get 2 troponins for further stratification. Aortic dissection considered, however there were with no typical symptoms of chest pain radiating through to intrascapular back, no severe hypertension, symmetric bilateral radial pulses, no associated neurologic deficits, no associated abdominal or lower extremity symptoms, no marfanoid features or evidence of underlying connective tissue disorder, and chest x-ray without evidence of mediastinal widening.  Will give ASA, check labs, troponin x 2, CXR, d-dimer and reassess  _________________________ 2:51 AM on 09/03/2019 -----------------------------------------  D-dimer elevated.  Patient was sent for CT angiogram of the chest which was negative for dissection, PE, pneumonia or any other acute findings.  This showed mild cardiomegaly but no signs of congestive heart failure.  2 troponins were negative.  Labs showing mildly elevated LFTs with no abdominal pain or tenderness.  Recommended close follow-up with PCP for further evaluation of that.  Will refer patient back to his primary care doctor for further evaluation as an outpatient.  Will recommend ibuprofen for possible musculoskeletal pain.  Discussed my standard return precautions.   As part of my medical decision making, I reviewed the following data within the electronic MEDICAL RECORD NUMBER Nursing notes reviewed and incorporated, Labs reviewed , EKG interpreted , Old EKG reviewed, Old chart reviewed, Radiograph reviewed , Notes from prior ED visits and [place] Controlled Substance Database   Please note:  Patient was evaluated in Emergency Department today for the symptoms described in the history of present illness. Patient was evaluated in the context of the global COVID-19 pandemic, which necessitated consideration that the patient might be at risk for  infection with the SARS-CoV-2 virus that causes COVID-19. Institutional protocols and algorithms that pertain to the evaluation of patients at risk for COVID-19 are in a state of rapid change based on information released by regulatory bodies including the CDC and federal and state organizations. These policies and algorithms were followed during the patient's care in the ED.  Some ED evaluations and interventions may be delayed as a result of limited staffing during the pandemic.   ____________________________________________   FINAL CLINICAL IMPRESSION(S) / ED DIAGNOSES   Final diagnoses:  Chest pain, unspecified type  Elevated LFTs      NEW MEDICATIONS STARTED DURING THIS VISIT:  ED Discharge Orders    None       Note:  This document was prepared using Dragon voice recognition software and may include unintentional dictation errors.    Nita Sickle, MD 09/03/19 [phone]

## 2019-09-03 NOTE — ED Notes (Signed)
Pt back from CT

## 2019-09-03 NOTE — Discharge Instructions (Signed)

## 2019-09-03 NOTE — ED Notes (Signed)
Pt to the er for chest pain x 1 week. Pt reports feeling "weird" and waking up with chest pain that felt like pressure. Pt denies hx of cardiac issues or GERD. Minimal chest pain at this time. Pain is intermittent. No distress at this time.

## 2019-09-03 NOTE — ED Notes (Signed)
Reviewed discharge instructions, follow-up care with patient. Patient verbalized understanding of all information reviewed. Patient stable, with no distress noted at this time.    

## 2019-09-04 DIAGNOSIS — F329 Major depressive disorder, single episode, unspecified: Secondary | ICD-10-CM | POA: Diagnosis not present

## 2019-09-11 DIAGNOSIS — F329 Major depressive disorder, single episode, unspecified: Secondary | ICD-10-CM | POA: Diagnosis not present

## 2019-09-22 DIAGNOSIS — F329 Major depressive disorder, single episode, unspecified: Secondary | ICD-10-CM | POA: Diagnosis not present

## 2019-09-23 DIAGNOSIS — J40 Bronchitis, not specified as acute or chronic: Secondary | ICD-10-CM | POA: Diagnosis not present

## 2019-09-23 DIAGNOSIS — Z03818 Encounter for observation for suspected exposure to other biological agents ruled out: Secondary | ICD-10-CM | POA: Diagnosis not present

## 2019-09-23 DIAGNOSIS — U071 COVID-19: Secondary | ICD-10-CM | POA: Diagnosis not present

## 2019-11-09 NOTE — Progress Notes (Signed)
No show

## 2019-11-11 ENCOUNTER — Telehealth (INDEPENDENT_AMBULATORY_CARE_PROVIDER_SITE_OTHER): Payer: Self-pay | Admitting: Neurology

## 2019-11-11 ENCOUNTER — Other Ambulatory Visit: Payer: Self-pay

## 2019-11-11 ENCOUNTER — Encounter: Payer: Self-pay | Admitting: Neurology

## 2020-03-25 ENCOUNTER — Emergency Department
Admission: EM | Admit: 2020-03-25 | Discharge: 2020-03-26 | Disposition: A | Discharge status: Home / Self Care | Payer: PRIVATE HEALTH INSURANCE | Attending: Emergency Medicine | Admitting: Emergency Medicine

## 2020-03-25 ENCOUNTER — Encounter: Payer: Self-pay | Admitting: Emergency Medicine

## 2020-03-25 ENCOUNTER — Emergency Department: Payer: PRIVATE HEALTH INSURANCE

## 2020-03-25 ENCOUNTER — Other Ambulatory Visit: Payer: Self-pay

## 2020-03-25 DIAGNOSIS — R110 Nausea: Secondary | ICD-10-CM | POA: Insufficient documentation

## 2020-03-25 DIAGNOSIS — R42 Dizziness and giddiness: Secondary | ICD-10-CM | POA: Insufficient documentation

## 2020-03-25 DIAGNOSIS — Z720 Tobacco use: Secondary | ICD-10-CM | POA: Insufficient documentation

## 2020-03-25 DIAGNOSIS — Z79899 Other long term (current) drug therapy: Secondary | ICD-10-CM | POA: Insufficient documentation

## 2020-03-25 DIAGNOSIS — R197 Diarrhea, unspecified: Secondary | ICD-10-CM | POA: Insufficient documentation

## 2020-03-25 LAB — CBC
HCT: 43.8 % (ref 39.0–52.0)
Hemoglobin: 15.1 g/dL (ref 13.0–17.0)
MCH: 31.1 pg (ref 26.0–34.0)
MCHC: 34.5 g/dL (ref 30.0–36.0)
MCV: 90.3 fL (ref 80.0–100.0)
Platelets: 323 10*3/uL (ref 150–400)
RBC: 4.85 MIL/uL (ref 4.22–5.81)
RDW: 12.6 % (ref 11.5–15.5)
WBC: 9.8 10*3/uL (ref 4.0–10.5)
nRBC: 0 % (ref 0.0–0.2)

## 2020-03-25 LAB — COMPREHENSIVE METABOLIC PANEL
ALT: 23 U/L (ref 0–44)
AST: 23 U/L (ref 15–41)
Albumin: 4.2 g/dL (ref 3.5–5.0)
Alkaline Phosphatase: 73 U/L (ref 38–126)
Anion gap: 10 (ref 5–15)
BUN: 11 mg/dL (ref 6–20)
CO2: 26 mmol/L (ref 22–32)
Calcium: 9 mg/dL (ref 8.9–10.3)
Chloride: 105 mmol/L (ref 98–111)
Creatinine, Ser: 1.18 mg/dL (ref 0.61–1.24)
GFR calc Af Amer: >60 mL/min (ref >60)
GFR calc non Af Amer: >60 mL/min (ref >60)
Glucose, Bld: 163 mg/dL — ABNORMAL HIGH (ref 70–99)
Potassium: 3.1 mmol/L — ABNORMAL LOW (ref 3.5–5.1)
Sodium: 141 mmol/L (ref 135–145)
Total Bilirubin: 0.7 mg/dL (ref 0.3–1.2)
Total Protein: 8.3 g/dL — ABNORMAL HIGH (ref 6.5–8.1)

## 2020-03-25 LAB — GLUCOSE, CAPILLARY: Glucose-Capillary: 118 mg/dL — ABNORMAL HIGH (ref 70–99)

## 2020-03-25 MED ORDER — PROMETHAZINE HCL 25 MG/ML IJ SOLN: 25.0000 mg | Freq: Once | INTRAMUSCULAR | Status: DC

## 2020-03-25 MED ORDER — MECLIZINE HCL 25 MG PO TABS
25.0000 mg | ORAL_TABLET | Freq: Once | ORAL | Status: AC
  Administered 2020-03-26: 25 mg via ORAL
  Filled 2020-03-25: qty 1

## 2020-03-25 MED ORDER — LACTATED RINGERS IV BOLUS
1000.0000 mL | Freq: Once | INTRAVENOUS | Status: AC
  Administered 2020-03-25: 1000 mL via INTRAVENOUS

## 2020-03-25 MED ORDER — ONDANSETRON HCL 4 MG/2ML IJ SOLN
4.0000 mg | Freq: Once | INTRAMUSCULAR | Status: AC
  Administered 2020-03-25: 4 mg via INTRAVENOUS
  Filled 2020-03-25: qty 2

## 2020-03-25 MED ORDER — LORAZEPAM 2 MG/ML IJ SOLN
1.0000 mg | Freq: Once | INTRAMUSCULAR | Status: AC
  Administered 2020-03-26: 1 mg via INTRAVENOUS
  Filled 2020-03-25: qty 1

## 2020-03-25 MED ORDER — SODIUM CHLORIDE 0.9 % IV SOLN: 25.0000 mg | Freq: Once | INTRAVENOUS | Status: DC

## 2020-03-25 MED ORDER — KETOROLAC TROMETHAMINE 30 MG/ML IJ SOLN
15.0000 mg | Freq: Once | INTRAMUSCULAR | Status: AC
  Administered 2020-03-25: 15 mg via INTRAVENOUS
  Filled 2020-03-25: qty 1

## 2020-03-25 MED ORDER — SODIUM CHLORIDE 0.9% FLUSH: 3.0000 mL | Freq: Once | INTRAVENOUS | Status: DC

## 2020-03-25 NOTE — ED Notes (Signed)
ED Provider at bedside. 

## 2020-03-25 NOTE — ED Triage Notes (Signed)
Pt presents to ED via EMS with c/o light headedness, diarrhea, and nausea. Pt states symptoms x 1 hour.    Facial symmetry intact, no numbness/tingling to face/extremities, grip strength equal but weak bilaterally.   This RN spoke with Dr. Erma Heritage regarding care, no orders for imaging.

## 2020-03-25 NOTE — ED Provider Notes (Signed)
[facility] Emergency Department Provider Note  ____________________________________________   First MD Initiated Contact with Patient 03/25/20 2029     (approximate)  I have reviewed the triage vital signs and the nursing notes.   HISTORY  Chief Complaint Dizziness, Nausea, and Diarrhea    HPI Roy Jackson is a 49 y.o. male with past medical history of chronic headaches, sleep apnea, here with nausea, vomiting, dizziness. History is somewhat limited as patient is actively vomiting on assessment. He reports that he was in his usual state of health until around noon today. He states he developed acute onset of nausea, vomiting, and had some loose stool. He had some mild abdominal cramping with this. He states that he also began to feel dizzy and lightheaded, like the room was spinning, and like he was going to pass out. The symptoms have all persisted and worsened since onset.        Past Medical History:  Diagnosis Date  . Chronic headaches   . History of hypogonadism 03/28/2019  . OSA (obstructive sleep apnea) 11/12/2017   Per sleep study 10/2017    Patient Active Problem List   Diagnosis Date Noted  . Hypogonadism in male 04/12/2019  . Obesity (BMI 30-39.9) 04/12/2019  . Vitamin D deficiency 03/28/2019  . Chronic intractable headache 03/28/2019  . History of hypogonadism 03/28/2019  . Decreased libido 03/28/2019  . Bilateral hand swelling 02/11/2019  . Excessive daytime sleepiness 02/11/2019  . Fatigue 02/11/2019  . Paresthesia of hand, bilateral 02/11/2019  . Paresthesia of right leg 02/11/2019  . OSA on CPAP 11/12/2017  . Chronic headaches   . Tobacco abuse 06/10/2014  . Unstable angina (HCC) 06/09/2014  . Chest pain 06/09/2014    Past Surgical History:  Procedure Laterality Date  . HERNIA REPAIR    . TONSILLECTOMY      Prior to Admission medications   Medication Sig Start Date End Date Taking? Authorizing Provider  naproxen  sodium (ALEVE) 220 MG tablet Take 220 mg by mouth daily as needed.    [provider]  nortriptyline (PAMELOR) 25 MG capsule Take 1 capsule (25 mg total) by mouth at bedtime. 07/07/19   Drema Dallas, DO  rizatriptan (MAXALT) 10 MG tablet Take 1 tablet earliest onset of headache.  May repeat in 2 hours if needed.  Maximum 2 tablets in 24 hours 07/07/19   Drema Dallas, DO  testosterone (ANDROGEL) 50 MG/5GM (1%) GEL Place 5 g onto the skin daily. 04/12/19   Henson, Vickie L, NP-C  vitamin B-12 (CYANOCOBALAMIN) 1000 MCG tablet Take 1 tablet (1,000 mcg total) by mouth daily. 03/29/19   Henson, Vickie L, NP-C  Vitamin D, Ergocalciferol, (DRISDOL) 1.25 MG (50000 UT) CAPS capsule Take 1 capsule (50,000 Units total) by mouth every 7 (seven) days. 03/29/19   Henson, Vickie L, NP-C    Allergies Darvocet [propoxyphene n-acetaminophen], Penicillins, and Sulfa antibiotics  Family History  Problem Relation Age of Onset  . Heart attack Other        maternal GF died in 12s, maternal uncles x 2 in 41s   . Hypertension Paternal Grandmother   . Hypertension Paternal Grandfather   . Diabetes Paternal Grandfather     Social History Social History   Tobacco Use  . Smoking status: Current Some Day Smoker  . Smokeless tobacco: Never Used  . Tobacco comment: last one 2-3 months ago  Vaping Use  . Vaping Use: Never used  Substance Use Topics  .  Alcohol use: Not Currently    Comment: occ  . Drug use: No    Review of Systems  Review of Systems  Constitutional: Negative for chills, fatigue and fever.  HENT: Negative for sore throat.   Respiratory: Negative for shortness of breath.   Cardiovascular: Negative for chest pain.  Gastrointestinal: Positive for nausea and vomiting. Negative for abdominal pain.  Genitourinary: Negative for flank pain.  Musculoskeletal: Negative for neck pain.  Skin: Negative for rash and wound.  Allergic/Immunologic: Negative for immunocompromised state.  Neurological:  Positive for dizziness. Negative for weakness and numbness.  Hematological: Does not bruise/bleed easily.     ____________________________________________  PHYSICAL EXAM:      VITAL SIGNS: ED Triage Vitals  Enc Vitals Group     BP 03/25/20 1601 139/76     Pulse Rate 03/25/20 1601 (!) 57     Resp 03/25/20 1601 18     Temp 03/25/20 1601 97.7 F (36.5 C)     Temp Source 03/25/20 1601 Oral     SpO2 03/25/20 1601 99 %     Weight 03/25/20 1600 242 lb (109.8 kg)     Height 03/25/20 1600 5\' 8"  (1.727 m)     Head Circumference --      Peak Flow --      Pain Score 03/25/20 1559 0     Pain Loc --      Pain Edu? --      Excl. in GC? --      Physical Exam Vitals and nursing note reviewed.  Constitutional:      General: He is not in acute distress.    Appearance: He is well-developed.     Comments: Appears uncomfortable, holding emesis bag  HENT:     Head: Normocephalic and atraumatic.     Mouth/Throat:     Mouth: Mucous membranes are dry.  Eyes:     Conjunctiva/sclera: Conjunctivae normal.  Cardiovascular:     Rate and Rhythm: Normal rate and regular rhythm.     Heart sounds: Normal heart sounds. No murmur heard.  No friction rub.  Pulmonary:     Effort: Pulmonary effort is normal. No respiratory distress.     Breath sounds: Normal breath sounds. No wheezing or rales.  Abdominal:     General: There is no distension.     Palpations: Abdomen is soft.     Tenderness: There is no abdominal tenderness.     Comments: Hyperactive bowel sounds but no overt TTP. No rebound or guarding.  Musculoskeletal:     Cervical back: Neck supple.  Skin:    General: Skin is warm.     Capillary Refill: Capillary refill takes less than 2 seconds.  Neurological:     Mental Status: He is alert and oriented to person, place, and time.     Motor: No abnormal muscle tone.     Comments: Neurological Exam:  Mental Status: Alert and oriented to person, place, and time. Attention and concentration  normal. Speech clear. Recent memory is intact. Cranial Nerves: Visual fields grossly intact. EOMI and PERRLA. Horizontal nystagmus noted. Facial sensation intact at forehead, maxillary cheek, and chin/mandible bilaterally. No facial asymmetry or weakness. Hearing grossly normal. Uvula is midline, and palate elevates symmetrically. Normal SCM and trapezius strength. Tongue midline without fasciculations. Motor: Muscle strength 5/5 in proximal and distal UE and LE bilaterally. No pronator drift. Muscle tone normal. Sensation: Intact to light touch in upper and lower extremities distally bilaterally.  Gait: Unable to  assess Coordination: UTA          ____________________________________________   LABS (all labs ordered are listed, but only abnormal results are displayed)  Labs Reviewed  COMPREHENSIVE METABOLIC PANEL - Abnormal; Notable for the following components:      Result Value   Potassium 3.1 (*)    Glucose, Bld 163 (*)    Total Protein 8.3 (*)    All other components within normal limits  GLUCOSE, CAPILLARY - Abnormal; Notable for the following components:   Glucose-Capillary 118 (*)    All other components within normal limits  CBC  URINALYSIS, COMPLETE (UACMP) WITH MICROSCOPIC  CBG MONITORING, ED    ____________________________________________  EKG: Sinus bradycardia, VR 55. PR 128, QRS 96, QTc 461. No acute St elevations or depressions.  ________________________________________  RADIOLOGY All imaging, including plain films, CT scans, and ultrasounds, independently reviewed by me, and interpretations confirmed via formal radiology reads.  ED MD interpretation:   CT Head: Pending, negative on my prelim review  Official radiology report(s): No results found.  ____________________________________________  PROCEDURES   Procedure(s) performed (including Critical Care):  Procedures  ____________________________________________  INITIAL IMPRESSION / MDM /  [place] / ED COURSE  As part of my medical decision making, I reviewed the following data within the [facility] notes reviewed and incorporated, Old chart reviewed, Notes from prior ED visits, and [place] Controlled Substance Database       *Roy Jackson was evaluated in Emergency Department on 03/25/2020 for the symptoms described in the history of present illness. He was evaluated in the context of the global COVID-19 pandemic, which necessitated consideration that the patient might be at risk for infection with the SARS-CoV-2 virus that causes COVID-19. Institutional protocols and algorithms that pertain to the evaluation of patients at risk for COVID-19 are in a state of rapid change based on information released by regulatory bodies including the CDC and federal and state organizations. These policies and algorithms were followed during the patient's care in the ED.  Some ED evaluations and interventions may be delayed as a result of limited staffing during the pandemic.*     Medical Decision Making: 49 year old male here with nausea, vomiting, dizziness. Initial concern for possible viral GI illness, though on secondary exam patient does have some horizontal nystagmus consistent with vertigo. He does have some reported tinnitus and recent sinus congestion, which would suggest a peripheral vertigo. He remains dizzy and nauseous, limiting remainder of exam at this time. Will give Ativan, meclizine, check a CT head, and reassess. He has no dysarthria, dysphagia, focal numbness/weakness, or other evidence of CVA at this time.  Patient care transferred to Dr. Beather Arbour at the end of my shift. Patient presentation, ED course, and plan of care discussed with review of all pertinent labs and imaging. Please see his/her note for further details regarding further ED course and disposition.  ____________________________________________  FINAL CLINICAL IMPRESSION(S) / ED  DIAGNOSES  Final diagnoses:  None     MEDICATIONS GIVEN DURING THIS VISIT:  Medications  sodium chloride flush (NS) 0.9 % injection 3 mL (has no administration in time range)  lactated ringers bolus 1,000 mL (has no administration in time range)  ondansetron (ZOFRAN) injection 4 mg (has no administration in time range)  ketorolac (TORADOL) 30 MG/ML injection 15 mg (has no administration in time range)     ED Discharge Orders    None       Note:  This document was  prepared using Conservation officer, historic buildings and may include unintentional dictation errors.   Shaune Pollack, MD 03/26/20 [phone]

## 2020-03-26 MED ORDER — ONDANSETRON 4 MG PO TBDP
4.0000 mg | ORAL_TABLET | Freq: Three times a day (TID) | ORAL | 0 refills | Status: DC | PRN
Start: 2020-03-26 — End: 2023-04-03

## 2020-03-26 MED ORDER — MECLIZINE HCL 25 MG PO TABS
25.0000 mg | ORAL_TABLET | Freq: Three times a day (TID) | ORAL | 0 refills | Status: DC | PRN
Start: 2020-03-26 — End: 2023-04-03

## 2020-03-26 MED ORDER — ONDANSETRON HCL 4 MG/2ML IJ SOLN
4.0000 mg | Freq: Once | INTRAMUSCULAR | Status: AC
  Administered 2020-03-26: 4 mg via INTRAVENOUS
  Filled 2020-03-26: qty 2

## 2020-03-26 NOTE — ED Notes (Signed)
Food and drink provided for PO challenge.  

## 2020-03-26 NOTE — ED Notes (Signed)
Pt not to have vomited. ER provider notified and order placed for zofran.

## 2020-03-26 NOTE — ED Provider Notes (Signed)
-----------------------------------------   1:23 AM on 03/26/2020 -----------------------------------------  Updated patient on CT scan results.  He is extremely drowsy from the IV Ativan.  Will continue to monitor and reassess.   ----------------------------------------- 2:49 AM on 03/26/2020 -----------------------------------------  Patient awoke enough to eat one graham cracker and promptly fell back asleep.  Still a little too drowsy for discharge.  Will continue to monitor and reassess.   ----------------------------------------- 5:13 AM on 03/26/2020 -----------------------------------------  Patient awake, alert, no focal neurological deficits.  Feeling significantly better.  Strict return precautions given.  Patient verbalizes understanding agrees with plan of care.   ----------------------------------------- 6:16 AM on 03/26/2020 -----------------------------------------  Patient vomited once when he got up for discharge.  Received 4 mg IV Zofran and felt well enough go home.  He was discharged in good and stable condition.   Irean Hong, MD 03/26/20 [phone]

## 2020-03-26 NOTE — ED Notes (Signed)
Pt states nausea has gotten better and he is good to go home. Provider notified

## 2020-03-26 NOTE — ED Notes (Signed)
Pt laying in bed NAD noted

## 2020-03-26 NOTE — ED Notes (Signed)
Pt states he has called for a ride

## 2020-03-26 NOTE — Discharge Instructions (Addendum)
1.  You may take medicines as needed for dizziness and nausea (Meclizine/Zofran #20). °2.  Return to the ER for worsening symptoms, persistent vomiting, difficulty breathing or other concerns. °

## 2020-10-10 ENCOUNTER — Telehealth: Payer: Self-pay

## 2020-10-10 NOTE — Telephone Encounter (Signed)
Call to patient to inform [facility] received positive STS (serologic syphilis test) by [facility]. RN offered for patient to have an appointment for confirmatory RPR. Patient schedule for 10/17/2020. DIS notified.   Harvie Heck, RN

## 2020-10-17 ENCOUNTER — Ambulatory Visit: Payer: Self-pay

## 2020-11-07 ENCOUNTER — Ambulatory Visit: Payer: Self-pay | Admitting: Physician Assistant

## 2020-11-07 ENCOUNTER — Other Ambulatory Visit: Payer: Self-pay

## 2020-11-07 DIAGNOSIS — Z113 Encounter for screening for infections with a predominantly sexual mode of transmission: Secondary | ICD-10-CM

## 2020-11-07 LAB — GRAM STAIN

## 2020-11-08 ENCOUNTER — Encounter: Payer: Self-pay | Admitting: Physician Assistant

## 2020-11-08 NOTE — Progress Notes (Signed)
[facility] Department STI clinic/screening visit  Subjective:  Roy Jackson is a 50 y.o. male being seen today for an STI screening visit. The patient reports they do not have symptoms.    Patient has the following medical conditions:   Patient Active Problem List   Diagnosis Date Noted  . Hypogonadism in male 04/12/2019  . Obesity (BMI 30-39.9) 04/12/2019  . Vitamin D deficiency 03/28/2019  . Chronic intractable headache 03/28/2019  . History of hypogonadism 03/28/2019  . Decreased libido 03/28/2019  . Bilateral hand swelling 02/11/2019  . Excessive daytime sleepiness 02/11/2019  . Fatigue 02/11/2019  . Paresthesia of hand, bilateral 02/11/2019  . Paresthesia of right leg 02/11/2019  . OSA on CPAP 11/12/2017  . Chronic headaches   . Tobacco abuse 06/10/2014  . Unstable angina (HCC) 06/09/2014  . Chest pain 06/09/2014     Chief Complaint  Patient presents with  . SEXUALLY TRANSMITTED DISEASE    screening    HPI  Patient reports that he gave blood at a plasma center and his RPR was positive and he was told to come here for confirmatory testing.  States that he also recently found out that his ex-wife was treated 2-3 weeks ago for Syphilis and Trich.  Denies chronic conditions and regular medicines.  States last HIV test was 3-4 years ago and last void prior to sample collection for Gram stain was 3 hr ago.   See flowsheet for further details and programmatic requirements.    The following portions of the patient's history were reviewed and updated as appropriate: allergies, current medications, past medical history, past social history, past surgical history and problem list.  Objective:  There were no vitals filed for this visit.  Physical Exam Constitutional:      General: He is not in acute distress.    Appearance: Normal appearance.  HENT:     Head: Normocephalic and atraumatic.     Comments: No nits,lice, or hair loss. No cervical,  supraclavicular or axillary adenopathy.    Mouth/Throat:     Mouth: Mucous membranes are moist.     Pharynx: Oropharynx is clear. No oropharyngeal exudate or posterior oropharyngeal erythema.  Eyes:     Conjunctiva/sclera: Conjunctivae normal.  Pulmonary:     Effort: Pulmonary effort is normal.  Abdominal:     Palpations: Abdomen is soft. There is no mass.     Tenderness: There is no abdominal tenderness. There is no guarding or rebound.  Genitourinary:    Penis: Normal.      Testes: Normal.     Comments: Pubic area without nits, lice, hair loss, edema, erythema, lesions and inguinal adenopathy. Penis circumcised without rash, lesions and discharge at meatus. Musculoskeletal:     Cervical back: Neck supple. No tenderness.  Skin:    General: Skin is warm and dry.     Findings: No bruising, erythema, lesion or rash.  Neurological:     Mental Status: He is alert and oriented to person, place, and time.  Psychiatric:        Mood and Affect: Mood normal.        Behavior: Behavior normal.        Thought Content: Thought content normal.        Judgment: Judgment normal.       Assessment and Plan:  Roy Jackson is a 50 y.o. male presenting to the [facility] Department for STI screening  1. Screening for STD (sexually transmitted disease) Patient  into clinic without symptoms. Discussed with patient Syphilis- dz, s/s, transmission and treatment if test is positive. Counseled patient re: false positive results and why confirmatory testing is needed. Reviewed with patient that Gram stain is normal and no treatment is indicated today. Rec condoms with all sex. Await test results.  Counseled that RN will call if needs to RTC for treatment once results are back. - Gram stain - Gonococcus culture - HIV [facility] - Syphilis Serology, [facility]     No follow-ups on file.  No future appointments.  Matt Holmes, PA

## 2020-11-12 ENCOUNTER — Telehealth: Payer: Self-pay

## 2020-11-12 LAB — GONOCOCCUS CULTURE

## 2020-11-12 NOTE — Telephone Encounter (Signed)
Call from [facility] DIS. Scheduling syphilis tx for patient with positive syphilis confirmatory test. Patient scheduled on Friday 11/16/2020 at 4 pm. Patient will be getting off of work and may arrive between 4:00 and 4:15.   Harvie Heck, RN

## 2020-11-14 LAB — HM HIV SCREENING LAB: HM HIV Screening: NEGATIVE

## 2020-11-16 ENCOUNTER — Other Ambulatory Visit: Payer: Self-pay

## 2020-11-16 ENCOUNTER — Ambulatory Visit: Payer: Self-pay | Admitting: Physician Assistant

## 2020-11-16 DIAGNOSIS — A515 Early syphilis, latent: Secondary | ICD-10-CM

## 2020-11-16 DIAGNOSIS — A539 Syphilis, unspecified: Secondary | ICD-10-CM

## 2020-11-16 MED ORDER — PENICILLIN G BENZATHINE 1200000 UNIT/2ML IM SUSP
2.4000 10*6.[IU] | Freq: Once | INTRAMUSCULAR | Status: AC
  Administered 2020-11-19: 2.4 10*6.[IU] via INTRAMUSCULAR

## 2020-11-16 NOTE — Progress Notes (Signed)
Patient here for syphilis tx today.   Harvie Heck, RN   Post:  RN admin Bicillin #2 with Tracey Harries RN. Patient tolerated well. Patient scheduled for second round on 11/23/2020. Provider orders complete.   Harvie Heck, RN

## 2020-11-17 ENCOUNTER — Encounter: Payer: Self-pay | Admitting: Physician Assistant

## 2020-11-17 DIAGNOSIS — A515 Early syphilis, latent: Secondary | ICD-10-CM | POA: Insufficient documentation

## 2020-11-17 NOTE — Progress Notes (Signed)
S:  Patient into clinic for treatment of Syphilis.  Reviewed with patient that his results show that he does have Syphilis and he needs treatment.  Patient states that around 1 month ago 2 days prior to his next appointment to donate plasma he was called by the plasma center and told that he had had 2 tests that were positive for Syphilis.  Patient states that he talked with his PCP and was told to come here for testing.  Patient affirms that he last had sex 14 months ago and that he found out recently that his ex-wife was treated for Syphilis.  Patient states that he is not allergic to any medicines.  O:  WDWN male in NAD, A&O x 3, normal work of breathinig, RPR from 11/07/2020 visit= reactive, 1:64 with Syphilis TP=reactive. A/P:  1.  Patient needs treatment for Syphilis. 2.  Counseled patient extensively on Syphilis- disease, transmission, that it can be asymptomatic, symptoms, and treatment regimens. 3.  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 4.  Patient again states that he is not allergic to any medicines. 5.  RN to room to give Bicillin and patient again states that he has no allergies. 6.  Putting in order for Bicillin, chart is flagged that he has that allergy.  Went to room to discuss with patient earlier report of no allergies.  Per patient he had itching and swelling as a child when he took penicillin, but he has been treated at least 3 times in the last few years with penicillin and related medicines and was told by his PCP that he had outgrown the allergy so it would be OK to take penicillin. 7.  Stressed to patient importance of monitoring himself over the next hours and that if he has any itching, swelling, rash that he take some Benadryl and have someone take him to the ER. Patient verbalized understanding and agrees with plan. 8.  Patient to call on 11/19/2020, to report if he had to go to  ER so that we can arrange for him to pick up alternative Doxycycline to complete his treatment for Syphilis if needed. 9.  RTC in 1 week for discussion and further treatment.  10.  Patient observed for approximately 30 minutes after injections and released without complications.

## 2020-11-19 NOTE — Progress Notes (Signed)
Event organiser for clinical support staff: I agree with the care provided to this patient and was available for consultation.  PI was sent the chart for review and spoke directly with PA [facility]. The patient denied allergies several times and was counseled appropriately. We will plan for bicillin x3 and if patient tolerates these shots the allergy will be removed from his chart.    Federico Flake, MD, MPH, ABFM [facility] Medical Director

## 2020-11-23 ENCOUNTER — Ambulatory Visit: Payer: Self-pay | Admitting: Physician Assistant

## 2020-11-23 ENCOUNTER — Other Ambulatory Visit: Payer: Self-pay

## 2020-11-23 DIAGNOSIS — A515 Early syphilis, latent: Secondary | ICD-10-CM

## 2020-11-23 MED ORDER — PENICILLIN G BENZATHINE 1200000 UNIT/2ML IM SUSP
2.4000 10*6.[IU] | INTRAMUSCULAR | Status: AC
  Administered 2020-11-23 – 2020-11-30 (×2): 2.4 10*6.[IU] via INTRAMUSCULAR

## 2020-11-23 NOTE — Progress Notes (Signed)
Patient denies problems with Bicillin injections from last week.  Orders placed for Bicillin 2.4 mu IM today and next Friday, 11/30/2020, to complete treatment.

## 2020-11-23 NOTE — Progress Notes (Signed)
Pt states he did not have any problems or issues after receiving bicillin injections last week. Pt in clinic for 2nd set of bicillin injections today.

## 2020-11-26 ENCOUNTER — Encounter: Payer: Self-pay | Admitting: Student

## 2020-11-30 ENCOUNTER — Other Ambulatory Visit: Payer: Self-pay

## 2020-11-30 ENCOUNTER — Ambulatory Visit: Payer: Self-pay | Admitting: Physician Assistant

## 2020-11-30 DIAGNOSIS — A515 Early syphilis, latent: Secondary | ICD-10-CM

## 2020-11-30 NOTE — Progress Notes (Signed)
S:  Patient into clinic for treatment #3 of 3 for latent Syphilis.  Denies any problems with previous treatments. O:  WDWN male in NAD, A&O x 3, normal work of breathing. A/P:  1.  Needs to complete treatment for Syphilis. 2. Bicillin 2.4 mu IM given today in R and L upper GM. 3. Reviewed with patient normal SE of injections and to use OTC analgesics as needed for SE. 4.  Reviewed with patient that if he starts to have any type or itching, swelling, or rash to take Benadryl and have someone take him to the ER. 5.  Counseled patient to RTC in 6 months for a blood draw to check to make sure his titer has dropped appropriately. 6.  No sex for 14 days. 7.  Condoms with all sex. 8.  Call with questions or concerns or if does go to the ER within the next 24- 48 hr.

## 2021-03-11 ENCOUNTER — Emergency Department
Admission: EM | Admit: 2021-03-11 | Discharge: 2021-03-11 | Disposition: A | Discharge status: Home / Self Care | Payer: Worker's Compensation | Attending: Emergency Medicine | Admitting: Emergency Medicine

## 2021-03-11 ENCOUNTER — Emergency Department: Payer: Worker's Compensation

## 2021-03-11 ENCOUNTER — Encounter: Payer: Self-pay | Admitting: Emergency Medicine

## 2021-03-11 ENCOUNTER — Other Ambulatory Visit: Payer: Self-pay

## 2021-03-11 DIAGNOSIS — W1789XA Other fall from one level to another, initial encounter: Secondary | ICD-10-CM | POA: Insufficient documentation

## 2021-03-11 DIAGNOSIS — R4182 Altered mental status, unspecified: Secondary | ICD-10-CM | POA: Diagnosis not present

## 2021-03-11 DIAGNOSIS — M79641 Pain in right hand: Secondary | ICD-10-CM | POA: Diagnosis not present

## 2021-03-11 DIAGNOSIS — M25531 Pain in right wrist: Secondary | ICD-10-CM | POA: Diagnosis not present

## 2021-03-11 DIAGNOSIS — Y990 Civilian activity done for income or pay: Secondary | ICD-10-CM | POA: Insufficient documentation

## 2021-03-11 DIAGNOSIS — T1490XA Injury, unspecified, initial encounter: Secondary | ICD-10-CM

## 2021-03-11 DIAGNOSIS — R1031 Right lower quadrant pain: Secondary | ICD-10-CM | POA: Diagnosis not present

## 2021-03-11 DIAGNOSIS — M25551 Pain in right hip: Secondary | ICD-10-CM | POA: Insufficient documentation

## 2021-03-11 DIAGNOSIS — W109XXA Fall (on) (from) unspecified stairs and steps, initial encounter: Secondary | ICD-10-CM

## 2021-03-11 DIAGNOSIS — R0789 Other chest pain: Secondary | ICD-10-CM | POA: Diagnosis not present

## 2021-03-11 DIAGNOSIS — F17200 Nicotine dependence, unspecified, uncomplicated: Secondary | ICD-10-CM | POA: Diagnosis not present

## 2021-03-11 DIAGNOSIS — M542 Cervicalgia: Secondary | ICD-10-CM

## 2021-03-11 LAB — COMPREHENSIVE METABOLIC PANEL
ALT: 20 U/L (ref 0–44)
AST: 19 U/L (ref 15–41)
Albumin: 4.3 g/dL (ref 3.5–5.0)
Alkaline Phosphatase: 62 U/L (ref 38–126)
Anion gap: 6 (ref 5–15)
BUN: 10 mg/dL (ref 6–20)
CO2: 28 mmol/L (ref 22–32)
Calcium: 9.1 mg/dL (ref 8.9–10.3)
Chloride: 108 mmol/L (ref 98–111)
Creatinine, Ser: 1.19 mg/dL (ref 0.61–1.24)
GFR, Estimated: >60 mL/min (ref >60)
Glucose, Bld: 93 mg/dL (ref 70–99)
Potassium: 3.5 mmol/L (ref 3.5–5.1)
Sodium: 142 mmol/L (ref 135–145)
Total Bilirubin: 0.8 mg/dL (ref 0.3–1.2)
Total Protein: 7.6 g/dL (ref 6.5–8.1)

## 2021-03-11 LAB — CBC WITH DIFFERENTIAL/PLATELET
Abs Immature Granulocytes: 0.01 10*3/uL (ref 0.00–0.07)
Basophils Absolute: 0 10*3/uL (ref 0.0–0.1)
Basophils Relative: 0 %
Eosinophils Absolute: 0 10*3/uL (ref 0.0–0.5)
Eosinophils Relative: 0 %
HCT: 43 % (ref 39.0–52.0)
Hemoglobin: 14.7 g/dL (ref 13.0–17.0)
Immature Granulocytes: 0 %
Lymphocytes Relative: 26 %
Lymphs Abs: 1.1 10*3/uL (ref 0.7–4.0)
MCH: 31.4 pg (ref 26.0–34.0)
MCHC: 34.2 g/dL (ref 30.0–36.0)
MCV: 91.9 fL (ref 80.0–100.0)
Monocytes Absolute: 0.2 10*3/uL (ref 0.1–1.0)
Monocytes Relative: 5 %
Neutro Abs: 3.1 10*3/uL (ref 1.7–7.7)
Neutrophils Relative %: 69 %
Platelets: 220 10*3/uL (ref 150–400)
RBC: 4.68 MIL/uL (ref 4.22–5.81)
RDW: 12.4 % (ref 11.5–15.5)
WBC: 4.4 10*3/uL (ref 4.0–10.5)
nRBC: 0 % (ref 0.0–0.2)

## 2021-03-11 LAB — TROPONIN I (HIGH SENSITIVITY): Troponin I (High Sensitivity): 3 ng/L (ref <18)

## 2021-03-11 MED ORDER — FENTANYL CITRATE (PF) 100 MCG/2ML IJ SOLN
75.0000 ug | Freq: Once | INTRAMUSCULAR | Status: AC
  Administered 2021-03-11: 75 ug via INTRAVENOUS
  Filled 2021-03-11: qty 2

## 2021-03-11 MED ORDER — OXYCODONE HCL 5 MG PO TABS: 5.0000 mg | ORAL_TABLET | Freq: Three times a day (TID) | ORAL | 0 refills | Status: AC | PRN

## 2021-03-11 MED ORDER — ONDANSETRON HCL 4 MG/2ML IJ SOLN
4.0000 mg | Freq: Once | INTRAMUSCULAR | Status: AC
  Administered 2021-03-11: 4 mg via INTRAVENOUS
  Filled 2021-03-11: qty 2

## 2021-03-11 NOTE — ED Notes (Signed)
Patient ambulated to and from hallway bathroom with a steady gait. Dr. Fuller Plan aware.

## 2021-03-11 NOTE — ED Triage Notes (Signed)
First Nurse note:  Arrives from [facility] for ED evaluation   AAOx3.  Skin warm and dry no apparent distress

## 2021-03-11 NOTE — ED Triage Notes (Signed)
Pt to ED via wheelchair from [facility], pt states was for [employer] event room. Pt states fell off the back of a work truck approx 5 feet, pt c/o head pain and lower back pain. Pt states +LOC, unknown for how long he was unconscious. Pt A&O X4 on arrival to ED.  Pt with noted cervical tenderness on assessment, placed in C-Collar at this time.

## 2021-03-11 NOTE — Discharge Instructions (Signed)
Leave splint in place and follow up ortho 1 week Leave collar in place and follow up neurosurg 1 week Call to make appointments Take tylenol and ibuprofen for pain. Take oxycodone for break through pain.   Please be aware that you were seen during a time of global shortage of iodinated contrast media. This means an alternative approach to your diagnosis and treatment may have been employed in order to provide optimal care during this shortage. If you have any worsening symptoms, please go to the nearest Emergency Department or call 911 immediately.  Take oxycodone as prescribed. Do not drink alcohol, drive or participate in any other potentially dangerous activities while taking this medication as it may make you sleepy. Do not take this medication with any other sedating medications, either prescription or over-the-counter. If you were prescribed Percocet or Vicodin, do not take these with acetaminophen (Tylenol) as it is already contained within these medications.  This medication is an opiate (or narcotic) pain medication and can be habit forming. Use it as little as possible to achieve adequate pain control. Do not use or use it with extreme caution if you have a history of opiate abuse or dependence. If you are on a pain contract with your primary care doctor or a pain specialist, be sure to let them know you were prescribed this medication today from the Emergency Department. This medication is intended for your use only - do not give any to anyone else and keep it in a secure place where nobody else, especially children, have access to it.

## 2021-03-11 NOTE — ED Provider Notes (Signed)
[facility] Emergency Department Provider Note  ____________________________________________   Event Date/Time   First MD Initiated Contact with Patient 03/11/21 1100     (approximate)  I have reviewed the triage vital signs and the nursing notes.   HISTORY  Chief Complaint Fall (/)    HPI Roy Jackson is a 50 y.o. male with chronic headaches who comes in for trauma.  Patient fell off the back of a work truck approximately 5-6 feet.  Patient reports falling onto his head and his back.  Patient reports pain, constant, nothing makes it better, worse with movement.  He also has pain in his right hand and right hip.  Patient reports that his last tetanus was 1 to 2 years ago is up-to-date.  He states that he fell due to slipping.  He does report some right chest wall pain as well as some right lower quadrant abdominal pain from the fall as well.  Positive LOC.  That was brief according to patient.  No blood thinners.          Past Medical History:  Diagnosis Date  . Chronic headaches   . History of hypogonadism 03/28/2019  . OSA (obstructive sleep apnea) 11/12/2017   Per sleep study 10/2017    Patient Active Problem List   Diagnosis Date Noted  . Syphilis, early latent 11/17/2020  . Hypogonadism in male 04/12/2019  . Obesity (BMI 30-39.9) 04/12/2019  . Vitamin D deficiency 03/28/2019  . Chronic intractable headache 03/28/2019  . History of hypogonadism 03/28/2019  . Decreased libido 03/28/2019  . Bilateral hand swelling 02/11/2019  . Excessive daytime sleepiness 02/11/2019  . Fatigue 02/11/2019  . Paresthesia of hand, bilateral 02/11/2019  . Paresthesia of right leg 02/11/2019  . OSA on CPAP 11/12/2017  . Chronic headaches   . Tobacco abuse 06/10/2014  . Unstable angina (HCC) 06/09/2014  . Chest pain 06/09/2014    Past Surgical History:  Procedure Laterality Date  . HERNIA REPAIR    . TONSILLECTOMY      Prior to Admission medications    Medication Sig Start Date End Date Taking? Authorizing Provider  meclizine (ANTIVERT) 25 MG tablet Take 1 tablet (25 mg total) by mouth 3 (three) times daily as needed for dizziness or nausea. Patient not taking: Reported on 11/07/2020 03/26/20   Irean Hong, MD  naproxen sodium (ALEVE) 220 MG tablet Take 220 mg by mouth daily as needed. Patient not taking: Reported on 11/07/2020    [provider]  nortriptyline (PAMELOR) 25 MG capsule Take 1 capsule (25 mg total) by mouth at bedtime. Patient not taking: Reported on 11/07/2020 07/07/19   Drema Dallas, DO  ondansetron (ZOFRAN ODT) 4 MG disintegrating tablet Take 1 tablet (4 mg total) by mouth every 8 (eight) hours as needed for nausea or vomiting. Patient not taking: Reported on 11/07/2020 03/26/20   Irean Hong, MD  rizatriptan (MAXALT) 10 MG tablet Take 1 tablet earliest onset of headache.  May repeat in 2 hours if needed.  Maximum 2 tablets in 24 hours Patient not taking: Reported on 11/07/2020 07/07/19   Drema Dallas, DO  testosterone (ANDROGEL) 50 MG/5GM (1%) GEL Place 5 g onto the skin daily. Patient not taking: Reported on 11/07/2020 04/12/19   Avanell Shackleton, NP-C  vitamin B-12 (CYANOCOBALAMIN) 1000 MCG tablet Take 1 tablet (1,000 mcg total) by mouth daily. Patient not taking: Reported on 11/07/2020 03/29/19   Avanell Shackleton, NP-C  Vitamin D, Ergocalciferol, (  DRISDOL) 1.25 MG (50000 UT) CAPS capsule Take 1 capsule (50,000 Units total) by mouth every 7 (seven) days. Patient not taking: Reported on 11/07/2020 03/29/19   Avanell Shackleton, NP-C    Allergies Darvocet [propoxyphene n-acetaminophen], Penicillins, and Sulfa antibiotics  Family History  Problem Relation Age of Onset  . Heart attack Other        maternal GF died in 37s, maternal uncles x 2 in 55s   . Hypertension Paternal Grandmother   . Hypertension Paternal Grandfather   . Diabetes Paternal Grandfather     Social History Social History   Tobacco Use  . Smoking status:  Current Some Day Smoker  . Smokeless tobacco: Never Used  . Tobacco comment: last one 2-3 months ago  Vaping Use  . Vaping Use: Never used  Substance Use Topics  . Alcohol use: Not Currently    Comment: occ  . Drug use: No      Review of Systems Constitutional: No fever/chills, fall Eyes: No visual changes. ENT: No sore throat. Cardiovascular: Positive chest wall pain Respiratory: Denies shortness of breath. Gastrointestinal: Positive abdominal pain no nausea, no vomiting.  No diarrhea.  No constipation. Genitourinary: Negative for dysuria. Musculoskeletal: Positive back pain Skin: Negative for rash. Neurological: Positive headache, no focal weakness or numbness. All other ROS negative ____________________________________________   PHYSICAL EXAM:  VITAL SIGNS: ED Triage Vitals  Enc Vitals Group     BP 03/11/21 1059 (!) 151/95     Pulse Rate 03/11/21 1059 74     Resp 03/11/21 1059 20     Temp 03/11/21 1059 98.3 F (36.8 C)     Temp Source 03/11/21 1059 Oral     SpO2 03/11/21 1059 96 %     Weight 03/11/21 1056 232 lb (105.2 kg)     Height 03/11/21 1056  (1.753 m)     Head Circumference --      Peak Flow --      Pain Score 03/11/21 1055 7     Pain Loc --      Pain Edu? --      Excl. in GC? --     Constitutional: Alert and oriented. GCS 15  Eyes: Conjunctivae are normal. EOMI. Head: Patient reports tenderness to the back of his head Nose: No congestion/rhinnorhea. Mouth/Throat: Mucous membranes are moist.   Neck: No stridor. Trachea Midline.  C-collar in place due to pain Cardiovascular: Normal rate, regular rhythm. Grossly normal heart sounds.  Good peripheral circulation.  Positive chest wall tenderness Respiratory: Normal respiratory effort.  No retractions. Lungs CTAB. Gastrointestinal: Positive abdominal tenderness no distention. No abdominal bruits.  Musculoskeletal:   RUE: Mild tenderness in the hand with small abrasion on the fingers.  Positive  snuffbox tenderness radial pulse intact. Neuro intact. Full ROM in joint. LUE: No point tenderness, deformity or other signs of injury. Radial pulse intact. Neuro intact. Full ROM in joints RLE: Mild tenderness on the right hip with pain with lifting up his right leg but still able to lift slightly up off the bed. DP pulse intact. Neuro intact. Full ROM in joints. LLE: No point tenderness, deformity or other signs of injury. DP pulse intact. Neuro intact. Full ROM in joints. Neurologic:  Normal speech and language. No gross focal neurologic deficits are appreciated.  Skin:  Skin is warm, dry and intact. No rash noted. Psychiatric: Mood and affect are normal. Speech and behavior are normal. GU: Deferred   ____________________________________________   LABS (all labs ordered  are listed, but only abnormal results are displayed)  Labs Reviewed  CBC WITH DIFFERENTIAL/PLATELET  COMPREHENSIVE METABOLIC PANEL  TROPONIN I (HIGH SENSITIVITY)  TROPONIN I (HIGH SENSITIVITY)   ____________________________________________   ED ECG REPORT I, Concha Se, the attending physician, personally viewed and interpreted this ECG.  Normal sinus rate of 65, no ST elevation, T wave version lead III, normal intervals ____________________________________________  RADIOLOGY Vela Prose, personally viewed and evaluated these images (plain radiographs) as part of my medical decision making, as well as reviewing the written report by the radiologist.  ED MD interpretation:  No acute fracture on xray   Official radiology report(s): CT Head Wo Contrast  Result Date: 03/11/2021 CLINICAL DATA:  Altered mental status. Status post fall off the back of a truck. Initial encounter. EXAM: CT HEAD WITHOUT CONTRAST CT CERVICAL SPINE WITHOUT CONTRAST TECHNIQUE: Multidetector CT imaging of the head and cervical spine was performed following the standard protocol without intravenous contrast. Multiplanar CT image  reconstructions of the cervical spine were also generated. COMPARISON:  Head CT 03/25/2020. FINDINGS: CT HEAD FINDINGS Brain: No evidence of acute infarction, hemorrhage, hydrocephalus, extra-axial collection or mass lesion/mass effect. Vascular: No hyperdense vessel or unexpected calcification. Skull: Intact.  No focal lesion. Sinuses/Orbits: Negative. Other: None. CT CERVICAL SPINE FINDINGS Alignment: Maintained. Straightening of the normal cervical lordosis noted. Skull base and vertebrae: No acute fracture. No primary bone lesion or focal pathologic process. Soft tissues and spinal canal: No prevertebral fluid or swelling. No visible canal hematoma. Disc levels: Intervertebral disc space height is maintained. There is some ossification of the posterior longitudinal ligament at C4, C5 and C6. Upper chest: Clear. Other: None. IMPRESSION: No acute abnormality head or cervical spine. Mild cervical degenerative change noted. Electronically Signed   By: Drusilla Kanner M.D.   On: 03/11/2021 12:28   CT Cervical Spine Wo Contrast  Result Date: 03/11/2021 CLINICAL DATA:  Altered mental status. Status post fall off the back of a truck. Initial encounter. EXAM: CT HEAD WITHOUT CONTRAST CT CERVICAL SPINE WITHOUT CONTRAST TECHNIQUE: Multidetector CT imaging of the head and cervical spine was performed following the standard protocol without intravenous contrast. Multiplanar CT image reconstructions of the cervical spine were also generated. COMPARISON:  Head CT 03/25/2020. FINDINGS: CT HEAD FINDINGS Brain: No evidence of acute infarction, hemorrhage, hydrocephalus, extra-axial collection or mass lesion/mass effect. Vascular: No hyperdense vessel or unexpected calcification. Skull: Intact.  No focal lesion. Sinuses/Orbits: Negative. Other: None. CT CERVICAL SPINE FINDINGS Alignment: Maintained. Straightening of the normal cervical lordosis noted. Skull base and vertebrae: No acute fracture. No primary bone lesion or focal  pathologic process. Soft tissues and spinal canal: No prevertebral fluid or swelling. No visible canal hematoma. Disc levels: Intervertebral disc space height is maintained. There is some ossification of the posterior longitudinal ligament at C4, C5 and C6. Upper chest: Clear. Other: None. IMPRESSION: No acute abnormality head or cervical spine. Mild cervical degenerative change noted. Electronically Signed   By: Drusilla Kanner M.D.   On: 03/11/2021 12:28   CT T-SPINE NO CHARGE  Result Date: 03/11/2021 CLINICAL DATA:  Fall EXAM: CT Thoracic and Lumbar spine without contrast TECHNIQUE: Multiplanar CT images of the thoracic and lumbar spine were reconstructed from contemporary CT of the Chest, Abdomen, and Pelvis CONTRAST:  None COMPARISON:  None FINDINGS: CT THORACIC SPINE FINDINGS Alignment: Preserved. Vertebrae: Vertebral body heights are maintained. No acute fracture. Paraspinal and other soft tissues: Extra-spinal findings are better evaluated on concurrent dedicated imaging. Disc  levels: Intervertebral disc heights are maintained. There is no significant degenerative stenosis. CT LUMBAR SPINE FINDINGS Segmentation: 5 lumbar type vertebrae. Alignment: Preserved Vertebrae: Vertebral body heights are maintained. There is no acute fracture. Paraspinal and other soft tissues: Extra-spinal findings are better evaluated on concurrent dedicated imaging. Disc levels: Intervertebral disc heights are maintained. There is no significant degenerative stenosis. IMPRESSION: No acute fracture of the thoracolumbar spine. Electronically Signed   By: Guadlupe SpanishPraneil  Patel M.D.   On: 03/11/2021 12:30   CT L-SPINE NO CHARGE  Result Date: 03/11/2021 CLINICAL DATA:  Fall EXAM: CT Thoracic and Lumbar spine without contrast TECHNIQUE: Multiplanar CT images of the thoracic and lumbar spine were reconstructed from contemporary CT of the Chest, Abdomen, and Pelvis CONTRAST:  None COMPARISON:  None FINDINGS: CT THORACIC SPINE FINDINGS  Alignment: Preserved. Vertebrae: Vertebral body heights are maintained. No acute fracture. Paraspinal and other soft tissues: Extra-spinal findings are better evaluated on concurrent dedicated imaging. Disc levels: Intervertebral disc heights are maintained. There is no significant degenerative stenosis. CT LUMBAR SPINE FINDINGS Segmentation: 5 lumbar type vertebrae. Alignment: Preserved Vertebrae: Vertebral body heights are maintained. There is no acute fracture. Paraspinal and other soft tissues: Extra-spinal findings are better evaluated on concurrent dedicated imaging. Disc levels: Intervertebral disc heights are maintained. There is no significant degenerative stenosis. IMPRESSION: No acute fracture of the thoracolumbar spine. Electronically Signed   By: Guadlupe SpanishPraneil  Patel M.D.   On: 03/11/2021 12:30   DG Hand Complete Right  Result Date: 03/11/2021 CLINICAL DATA:  Pain following fall EXAM: RIGHT HAND - COMPLETE 3+ VIEW COMPARISON:  June 09, 2012. FINDINGS: Frontal, oblique, and lateral views were obtained. There is no appreciable acute fracture or dislocation. Evidence of old healed fracture of the distal fifth metacarpal. There is narrowing of the fifth DIP joint with spurring in this joint. There is mild spurring in the first IP joint. Other joint spaces appear unremarkable. No erosive change. IMPRESSION: Old healed fracture distal fifth metacarpal with remodeling. No acute fracture or dislocation evident. Osteoarthritic change noted in the fifth DIP joint and to a lesser extent in the first IP joint. Other joint spaces appear unremarkable. Electronically Signed   By: Bretta BangWilliam  Woodruff III M.D.   On: 03/11/2021 12:39   DG Hip Unilat W or Wo Pelvis 2-3 Views Right  Result Date: 03/11/2021 CLINICAL DATA:  Status post fall off a truck. Right hip pain. Initial encounter. EXAM: DG HIP (WITH OR WITHOUT PELVIS) 2-3V RIGHT COMPARISON:  None. FINDINGS: There is no acute bony or joint abnormality the right or  left. 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 Mesh from prior hernia repair noted. IMPRESSION: No acute abnormality. Electronically Signed   By: Drusilla Kannerhomas  Dalessio M.D.   On: 03/11/2021 12:37   CT CHEST ABDOMEN PELVIS WO CONTRAST  Result Date: 03/11/2021 CLINICAL DATA:  Larey SeatFell from from a truck approximately 5 feet. Low back pain. EXAM: CT CHEST, ABDOMEN AND PELVIS WITHOUT CONTRAST TECHNIQUE: Multidetector CT imaging of the chest, abdomen and pelvis was performed following the standard protocol without IV contrast. COMPARISON:  Chest CT 09/03/2019 FINDINGS: CT CHEST FINDINGS Cardiovascular: The heart is normal in size. No pericardial effusion. The aorta is normal in caliber. Mediastinum/Nodes: No mediastinal or hilar mass or adenopathy or hematoma. The esophagus is grossly normal. Lungs/Pleura: No acute pulmonary findings. No pleural effusions or pneumothorax. No pulmonary contusion or pulmonary lesions. Musculoskeletal: The bony thorax is intact. No sternal, rib or thoracic vertebral body fractures are identified. CT ABDOMEN PELVIS FINDINGS Hepatobiliary:  No acute hepatic injury or biliary dilatation. No perihepatic fluid collections. The gallbladder is unremarkable. No common bile duct dilatation. Pancreas: No evidence of acute pancreatic injury. No peripancreatic fluid collections. Spleen: Spleen is intact.  No perisplenic fluid collections. Adrenals/Urinary Tract: The adrenal glands and kidneys are unremarkable. No perisplenic hematoma. The bladder is unremarkable. Stomach/Bowel: The stomach, duodenum, small bowel and colon are grossly normal without oral contrast. No inflammatory changes, mass lesions or obstructive findings. The appendix is normal. Vascular/Lymphatic: The aorta is normal in caliber. No atheroscerlotic calcifications. No mesenteric of retroperitoneal mass or adenopathy. Small scattered lymph nodes are noted. Reproductive: The prostate gland seminal vesicles are unremarkable. Other: No pelvic  mass or adenopathy. No free pelvic fluid collections. No inguinal mass or adenopathy. No abdominal wall hernia or subcutaneous lesions. Evidence of prior left inguinal hernia repair with mesh. No recurrent hernia. Musculoskeletal: The lumbar vertebral bodies are normally aligned. No acute fracture. The facets are normally aligned. No pars defects. The bony pelvis is intact. The pubic symphysis and SI joints are intact. No pelvic fractures. Both hips are normally located. Moderate age advanced degenerative changes bilaterally, right slightly worse than left. IMPRESSION: 1. No CT findings for acute injury involving the chest, abdomen or pelvis. 2. Age advanced degenerative changes involving the hips bilaterally, right slightly worse than left. 3. Remote left inguinal hernia repair with mesh. No recurrent hernia. Electronically Signed   By: Rudie Meyer M.D.   On: 03/11/2021 12:33    ____________________________________________   PROCEDURES  Procedure(s) performed (including Critical Care):  Procedures   ____________________________________________   INITIAL IMPRESSION / ASSESSMENT AND PLAN / ED COURSE    Roy Jackson was evaluated in Emergency Department on 03/11/2021 for the symptoms described in the history of present illness. He was evaluated in the context of the global COVID-19 pandemic, which necessitated consideration that the patient might be at risk for infection with the SARS-CoV-2 virus that causes COVID-19. Institutional protocols and algorithms that pertain to the evaluation of patients at risk for COVID-19 are in a state of rapid change based on information released by regulatory bodies including the CDC and federal and state organizations. These policies and algorithms were followed during the patient's care in the ED.    Immediate Considerations in trauma patient: Head Injury Airway compromise/injury Chest Injury and Abdominal Injury - including hemo/pneumothorax, cardiac,  abdominal solid and hollow organ injury Spinal Cord or Vertebral injury Vascular compromise/injury Fractures  This patient was evaluated during a time of global shortage of iodinated contrast media. Based on guidance from the [employer] of Radiology, best practices, and local institutional approaches an alternative path for evaluating and managing the patient may have been employed in order to provide optimal care during this shortage. The current situation has been discussed with the patient.   Will proceed with pan scan due to patient's significant tenderness throughout and large mechanism.  Labs are reassuring   X-rays are negative  Some degenerative changes in the hips which I did alert patient to.  Patient does have some snuffbox tenderness therefore patient was placed in thumb spica splint and will give follow-up with orthopedics on attempted to clear the collar patient also had significant C-spine tenderness therefore patient be left in a c-collar and follow-up with neurosurgery.  Patient has been ambulatory and will discharge home with follow-up.  We will give a few pain medication to help with the pain and we discussed risk for addiction and not working or driving while  on the  Patient's vital signs are stable.  I discussed the provisional nature of ED diagnosis, the treatment so far, the ongoing plan of care, follow up appointments and return precautions with the patient and any family or support people present. They expressed understanding and agreed with the plan, discharged home.     ____________________________________________   FINAL CLINICAL IMPRESSION(S) / ED DIAGNOSES   Final diagnoses:  Fall (on) (from) unspecified stairs and steps, initial encounter  Wrist pain, acute, right  Cervical pain (neck)      MEDICATIONS GIVEN DURING THIS VISIT:  Medications  fentaNYL (SUBLIMAZE) injection 75 mcg (75 mcg Intravenous Given 03/11/21 1146)  ondansetron  (ZOFRAN) injection 4 mg (4 mg Intravenous Given 03/11/21 1144)     ED Discharge Orders         Ordered    oxyCODONE (ROXICODONE) 5 MG immediate release tablet  Every 8 hours PRN        03/11/21 1448           Note:  This document was prepared using Dragon voice recognition software and may include unintentional dictation errors.   Concha Se, MD 03/11/21 [phone]

## 2022-05-26 ENCOUNTER — Encounter: Payer: Self-pay | Admitting: Emergency Medicine

## 2022-05-26 ENCOUNTER — Emergency Department
Admission: EM | Admit: 2022-05-26 | Discharge: 2022-05-26 | Disposition: A | Discharge status: Home / Self Care | Payer: Managed Care, Other (non HMO) | Attending: Student in an Organized Health Care Education/Training Program | Admitting: Student in an Organized Health Care Education/Training Program

## 2022-05-26 ENCOUNTER — Emergency Department: Payer: Managed Care, Other (non HMO)

## 2022-05-26 ENCOUNTER — Other Ambulatory Visit: Payer: Self-pay

## 2022-05-26 DIAGNOSIS — Z5321 Procedure and treatment not carried out due to patient leaving prior to being seen by health care provider: Secondary | ICD-10-CM | POA: Diagnosis not present

## 2022-05-26 DIAGNOSIS — R110 Nausea: Secondary | ICD-10-CM | POA: Diagnosis not present

## 2022-05-26 DIAGNOSIS — R42 Dizziness and giddiness: Secondary | ICD-10-CM | POA: Insufficient documentation

## 2022-05-26 DIAGNOSIS — E860 Dehydration: Secondary | ICD-10-CM | POA: Diagnosis not present

## 2022-05-26 LAB — PROTIME-INR
INR: 1 (ref 0.8–1.2)
Prothrombin Time: 13.2 seconds (ref 11.4–15.2)

## 2022-05-26 LAB — COMPREHENSIVE METABOLIC PANEL
ALT: 26 U/L (ref 0–44)
AST: 22 U/L (ref 15–41)
Albumin: 4.4 g/dL (ref 3.5–5.0)
Alkaline Phosphatase: 72 U/L (ref 38–126)
Anion gap: 5 (ref 5–15)
BUN: 11 mg/dL (ref 6–20)
CO2: 27 mmol/L (ref 22–32)
Calcium: 9.1 mg/dL (ref 8.9–10.3)
Chloride: 109 mmol/L (ref 98–111)
Creatinine, Ser: 1.07 mg/dL (ref 0.61–1.24)
GFR, Estimated: >60 mL/min (ref >60)
Glucose, Bld: 118 mg/dL — ABNORMAL HIGH (ref 70–99)
Potassium: 3.4 mmol/L — ABNORMAL LOW (ref 3.5–5.1)
Sodium: 141 mmol/L (ref 135–145)
Total Bilirubin: 0.5 mg/dL (ref 0.3–1.2)
Total Protein: 8 g/dL (ref 6.5–8.1)

## 2022-05-26 LAB — CBC
HCT: 44.1 % (ref 39.0–52.0)
Hemoglobin: 14.6 g/dL (ref 13.0–17.0)
MCH: 30.6 pg (ref 26.0–34.0)
MCHC: 33.1 g/dL (ref 30.0–36.0)
MCV: 92.5 fL (ref 80.0–100.0)
Platelets: 266 10*3/uL (ref 150–400)
RBC: 4.77 MIL/uL (ref 4.22–5.81)
RDW: 12.4 % (ref 11.5–15.5)
WBC: 4.9 10*3/uL (ref 4.0–10.5)
nRBC: 0 % (ref 0.0–0.2)

## 2022-05-26 LAB — DIFFERENTIAL
Abs Immature Granulocytes: 0.01 10*3/uL (ref 0.00–0.07)
Basophils Absolute: 0 10*3/uL (ref 0.0–0.1)
Basophils Relative: 0 %
Eosinophils Absolute: 0 10*3/uL (ref 0.0–0.5)
Eosinophils Relative: 1 %
Immature Granulocytes: 0 %
Lymphocytes Relative: 34 %
Lymphs Abs: 1.7 10*3/uL (ref 0.7–4.0)
Monocytes Absolute: 0.3 10*3/uL (ref 0.1–1.0)
Monocytes Relative: 6 %
Neutro Abs: 2.9 10*3/uL (ref 1.7–7.7)
Neutrophils Relative %: 59 %

## 2022-05-26 LAB — APTT: aPTT: 32 seconds (ref 24–36)

## 2022-05-26 MED ORDER — ONDANSETRON 4 MG PO TBDP: 4.0000 mg | ORAL_TABLET | Freq: Once | ORAL | Status: DC

## 2022-05-26 NOTE — ED Triage Notes (Signed)
Says dizziness since yesterday eve.  Today it is worsening and vomiting as well.  ;says this happened before and it was comb9ination of ear problem and dehydration.

## 2022-05-26 NOTE — ED Notes (Signed)
Attempted to room patient x3 with no answer from lobby. Patient not visualized.

## 2022-05-26 NOTE — ED Provider Triage Note (Signed)
Emergency Medicine Provider Triage Evaluation Note  Roy Jackson , a 51 y.o. male  was evaluated in triage.  Pt complains of dizziness that started yesterday. History of veritgo, ear problems and dehydration.  Nausea now.   Review of Systems  Positive: +nausea, dizziness Negative: No fever, chills, h/a or bodyaches.    Physical Exam  BP (!) 155/83   Pulse 64   Temp 98.3 F (36.8 C) (Oral)   Resp 14   Ht 5\' 9"  (1.753 m)   Wt 109.8 kg   SpO2 96%   BMI 35.74 kg/m  Gen:   Awake, no distress   Resp:  Normal effort Lungs clear bilat.  MSK:   Moves extremities without difficulty.   Other:  Able to walk without assistance.    Medical Decision Making  Medically screening exam initiated at 1:28 PM.  Appropriate orders placed.  was informed that the remainder of the evaluation will be completed by another provider, this initial triage assessment does not replace that evaluation, and the importance of remaining in the ED until their evaluation is complete.     [name], PA-C 05/26/22 1415

## 2022-06-12 IMAGING — CR DG HIP (WITH OR WITHOUT PELVIS) 2-3V*R*
3 series · 3 of 3 positions shown · non-contrast
Comparison: None.

CLINICAL DATA: Status post fall off a truck. Right hip pain.
Initial encounter.

EXAM:
DG HIP (WITH OR WITHOUT PELVIS) 2-3V RIGHT

[pelvis ap]
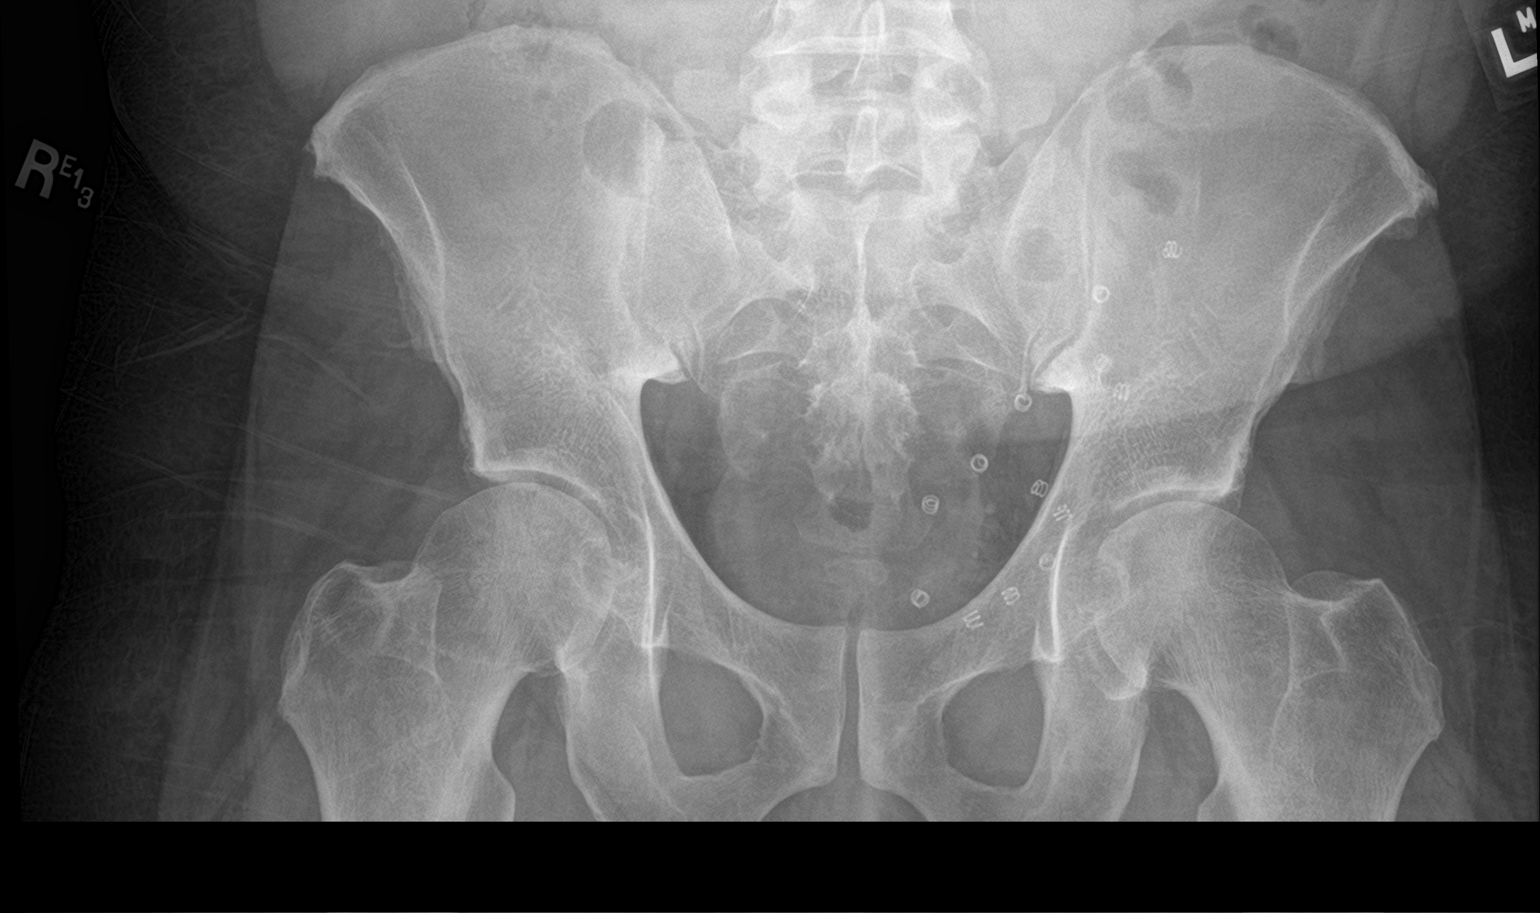

[hip ap]
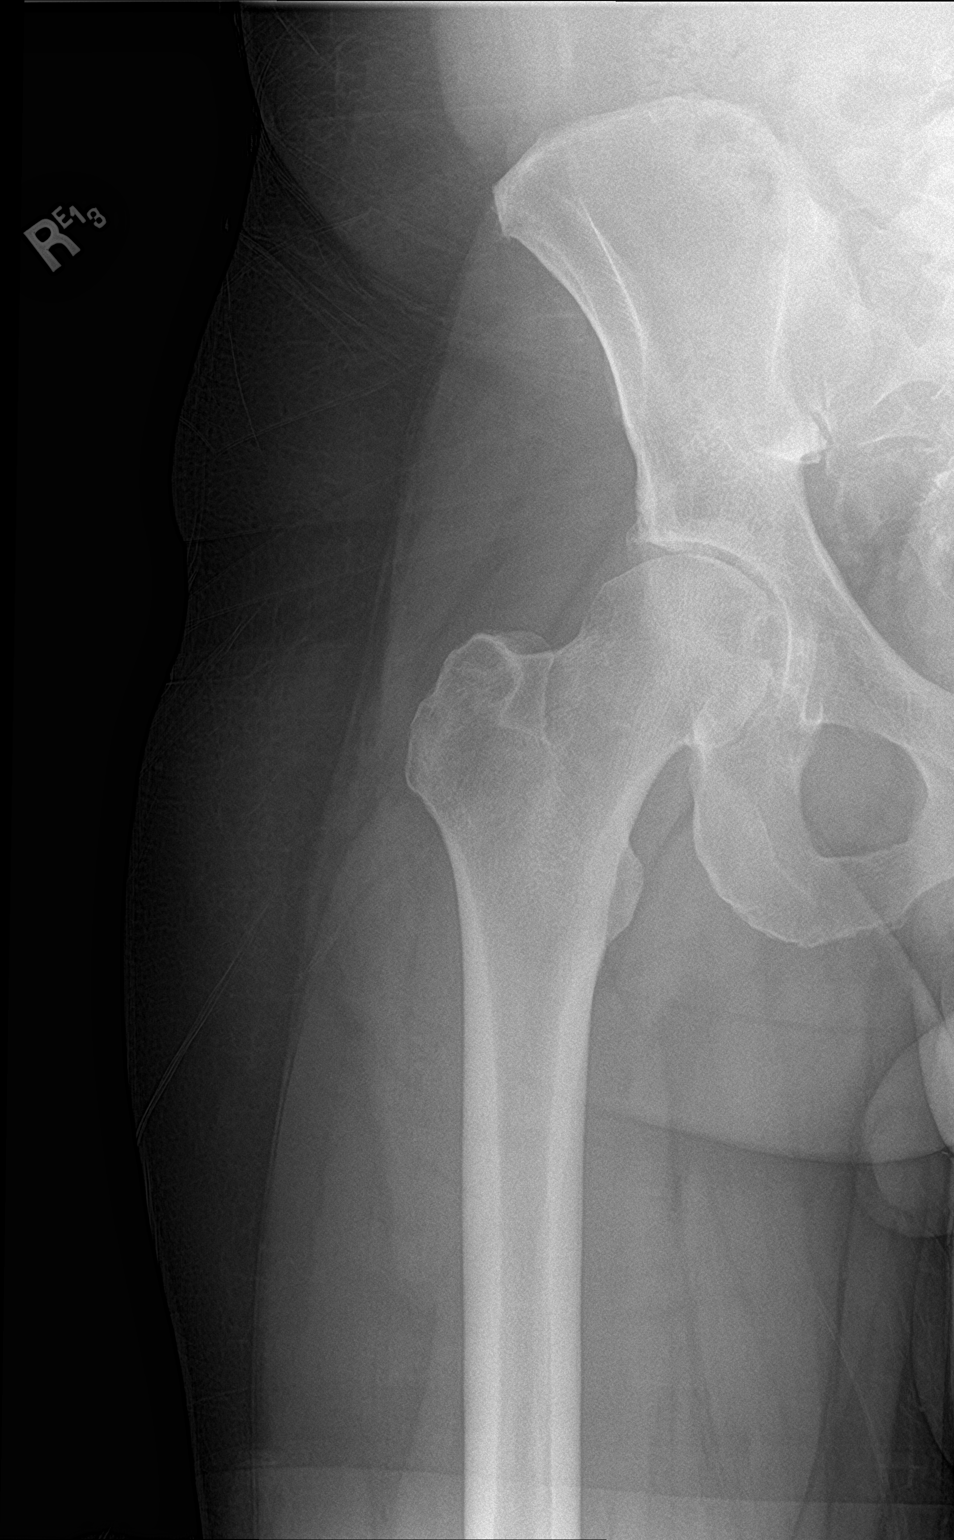

[hip lat]
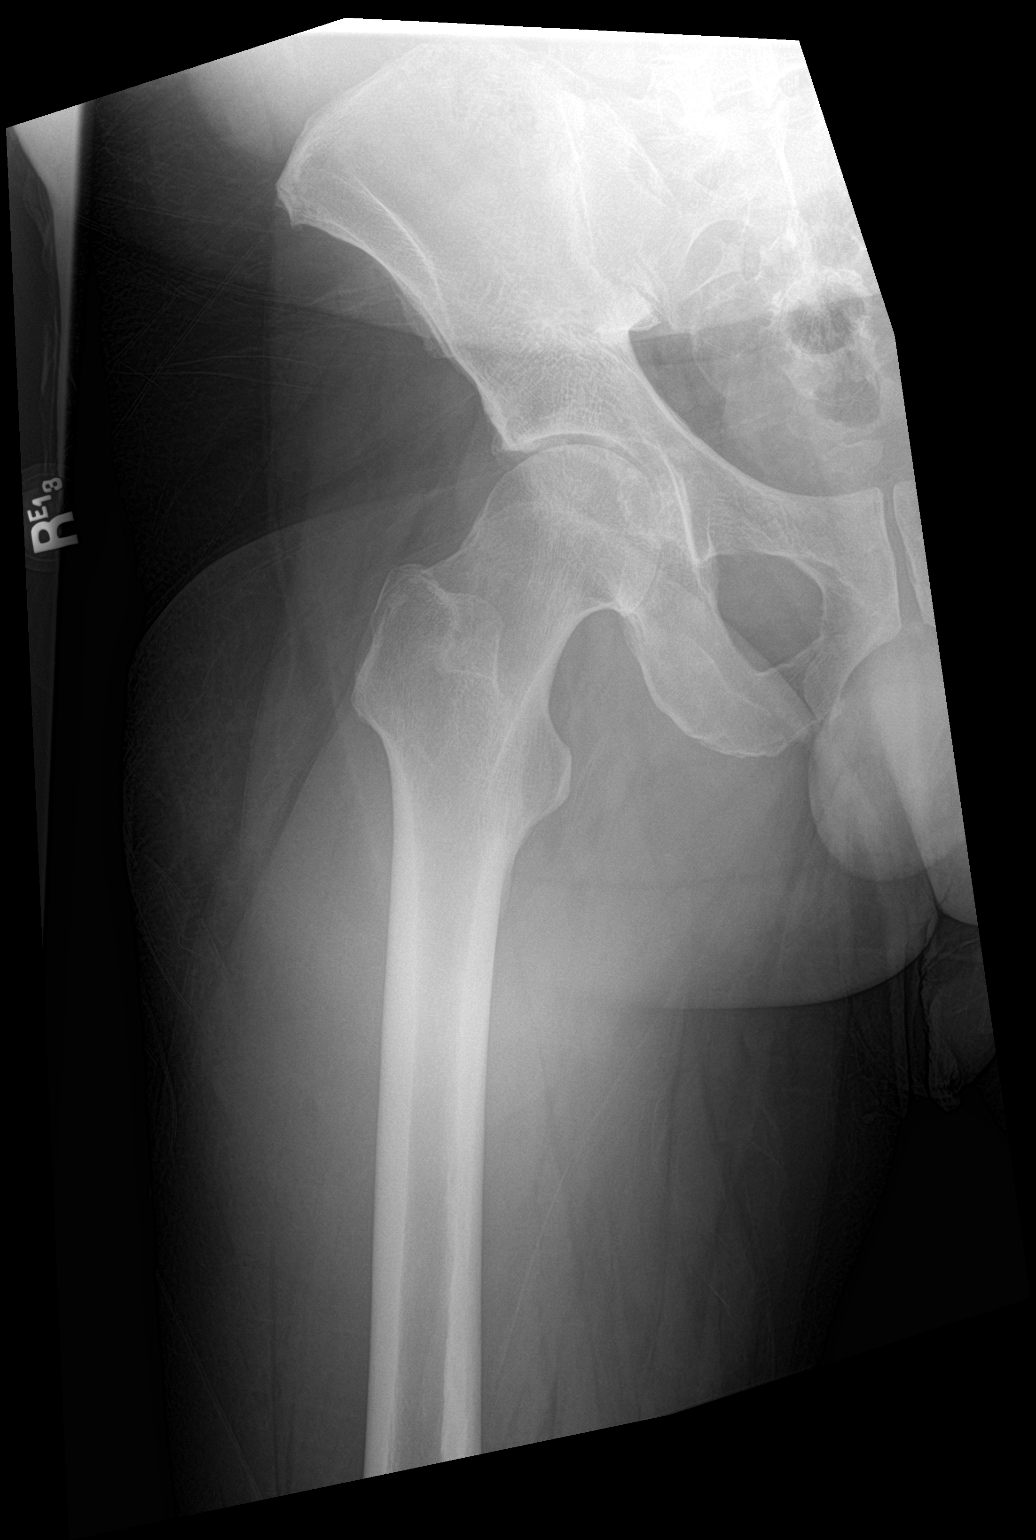

[3 of 3 positions shown; findings below may reference images not displayed]

FINDINGS: There is no acute bony or joint abnormality the right or left. Mild
bilateral hip osteoarthritis is seen. Mesh from prior hernia repair
noted.
IMPRESSION: No acute abnormality.

## 2022-06-12 IMAGING — CR DG HAND COMPLETE 3+V*R*
3 series · 3 of 3 positions shown · non-contrast
Comparison: June 09, 2012.

CLINICAL DATA: Pain following fall

EXAM:
RIGHT HAND - COMPLETE 3+ VIEW

[hand ap]
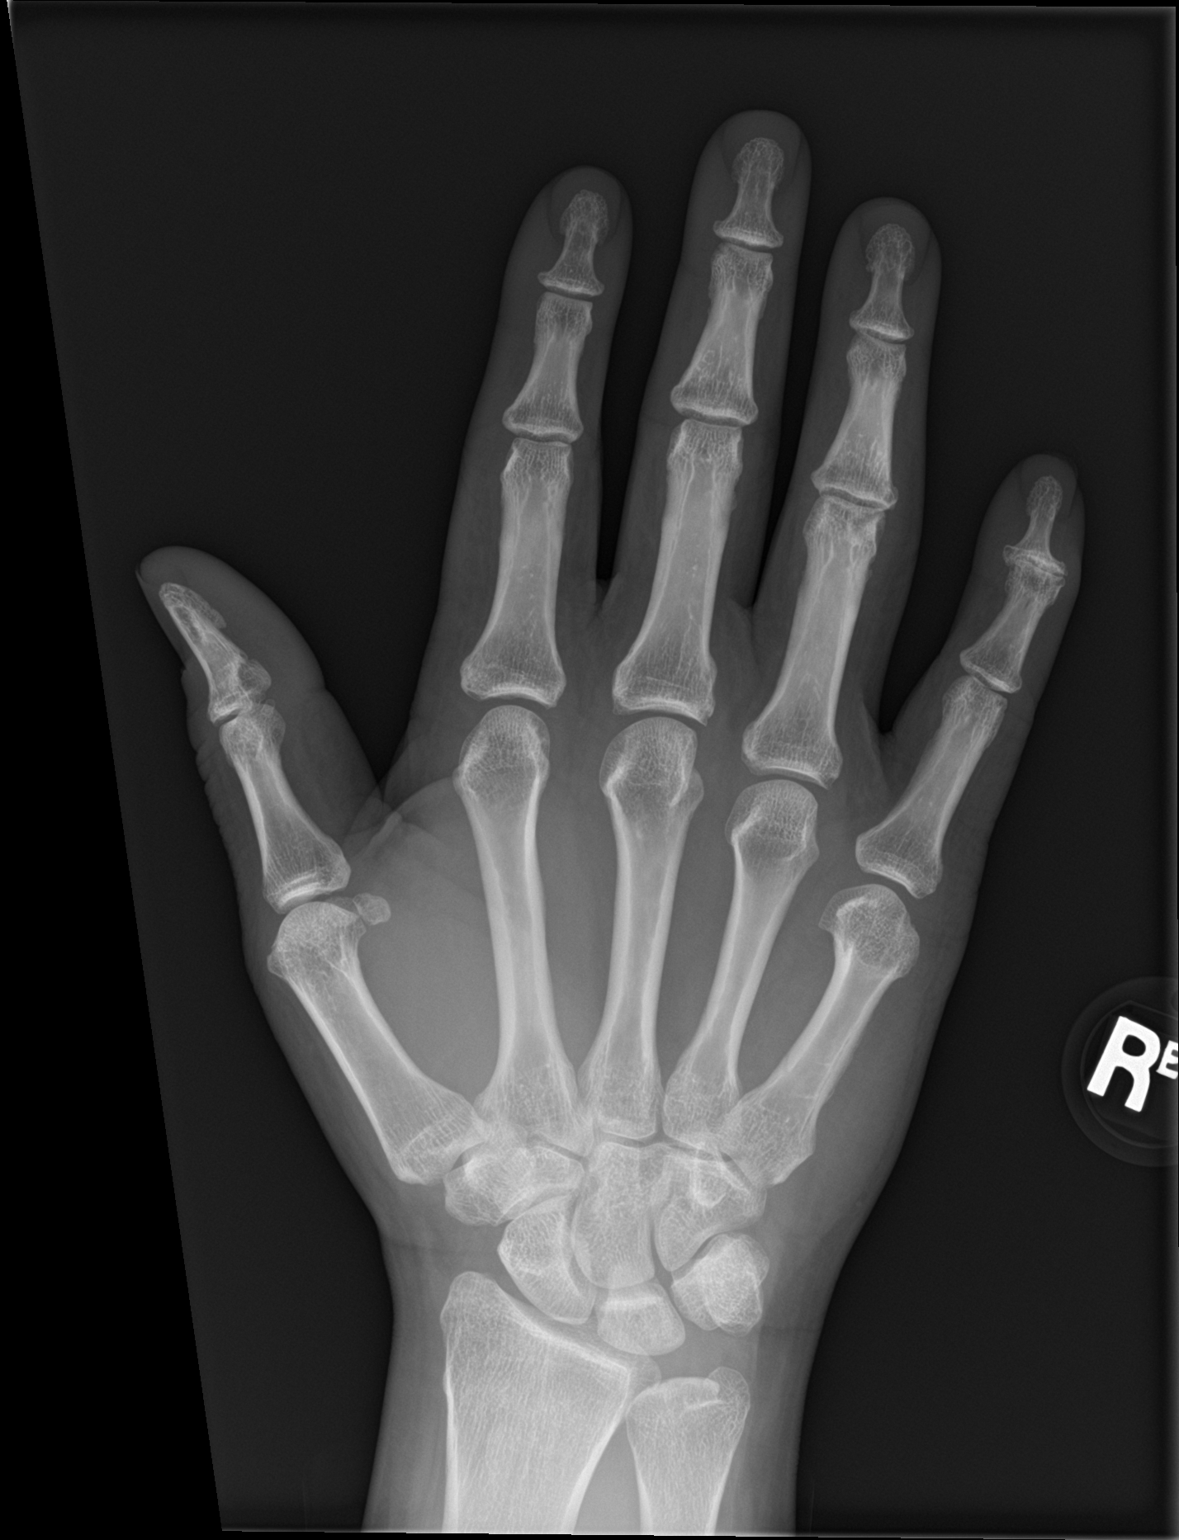

[hand obl]
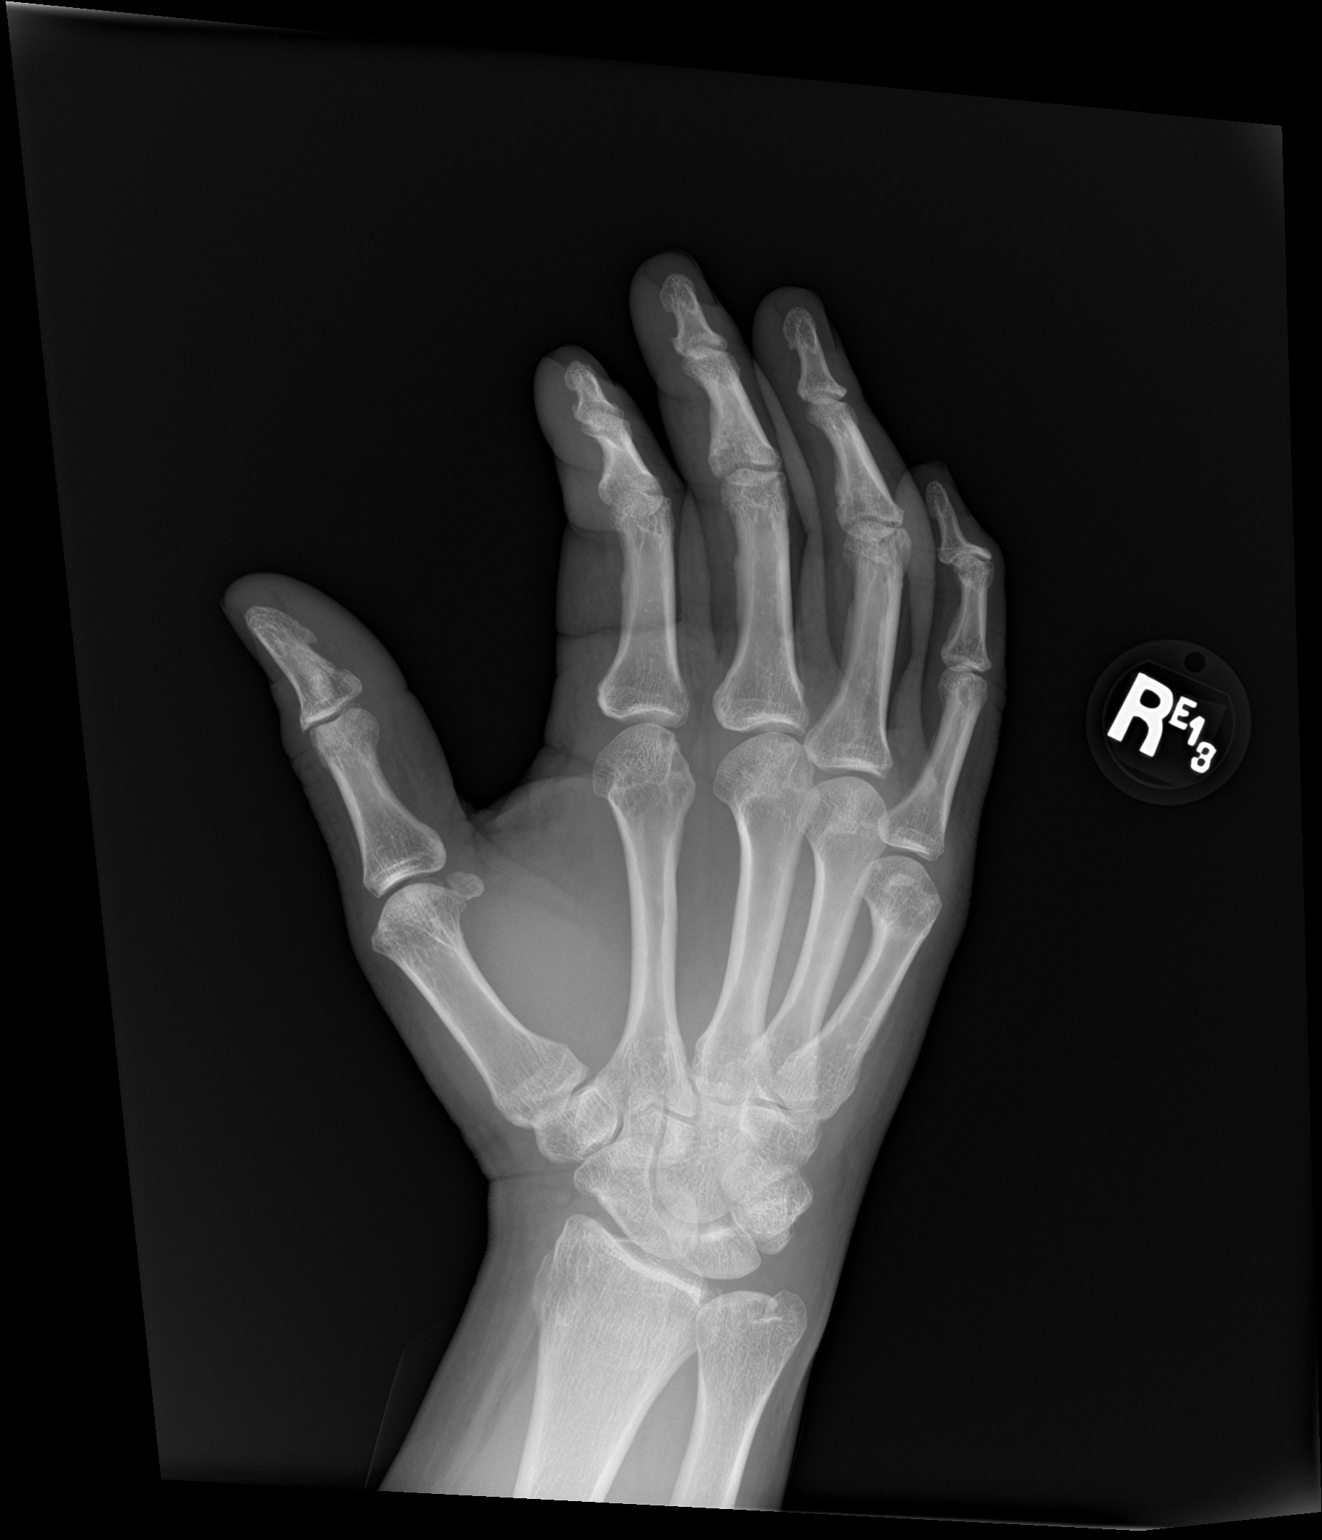

[hand lat]
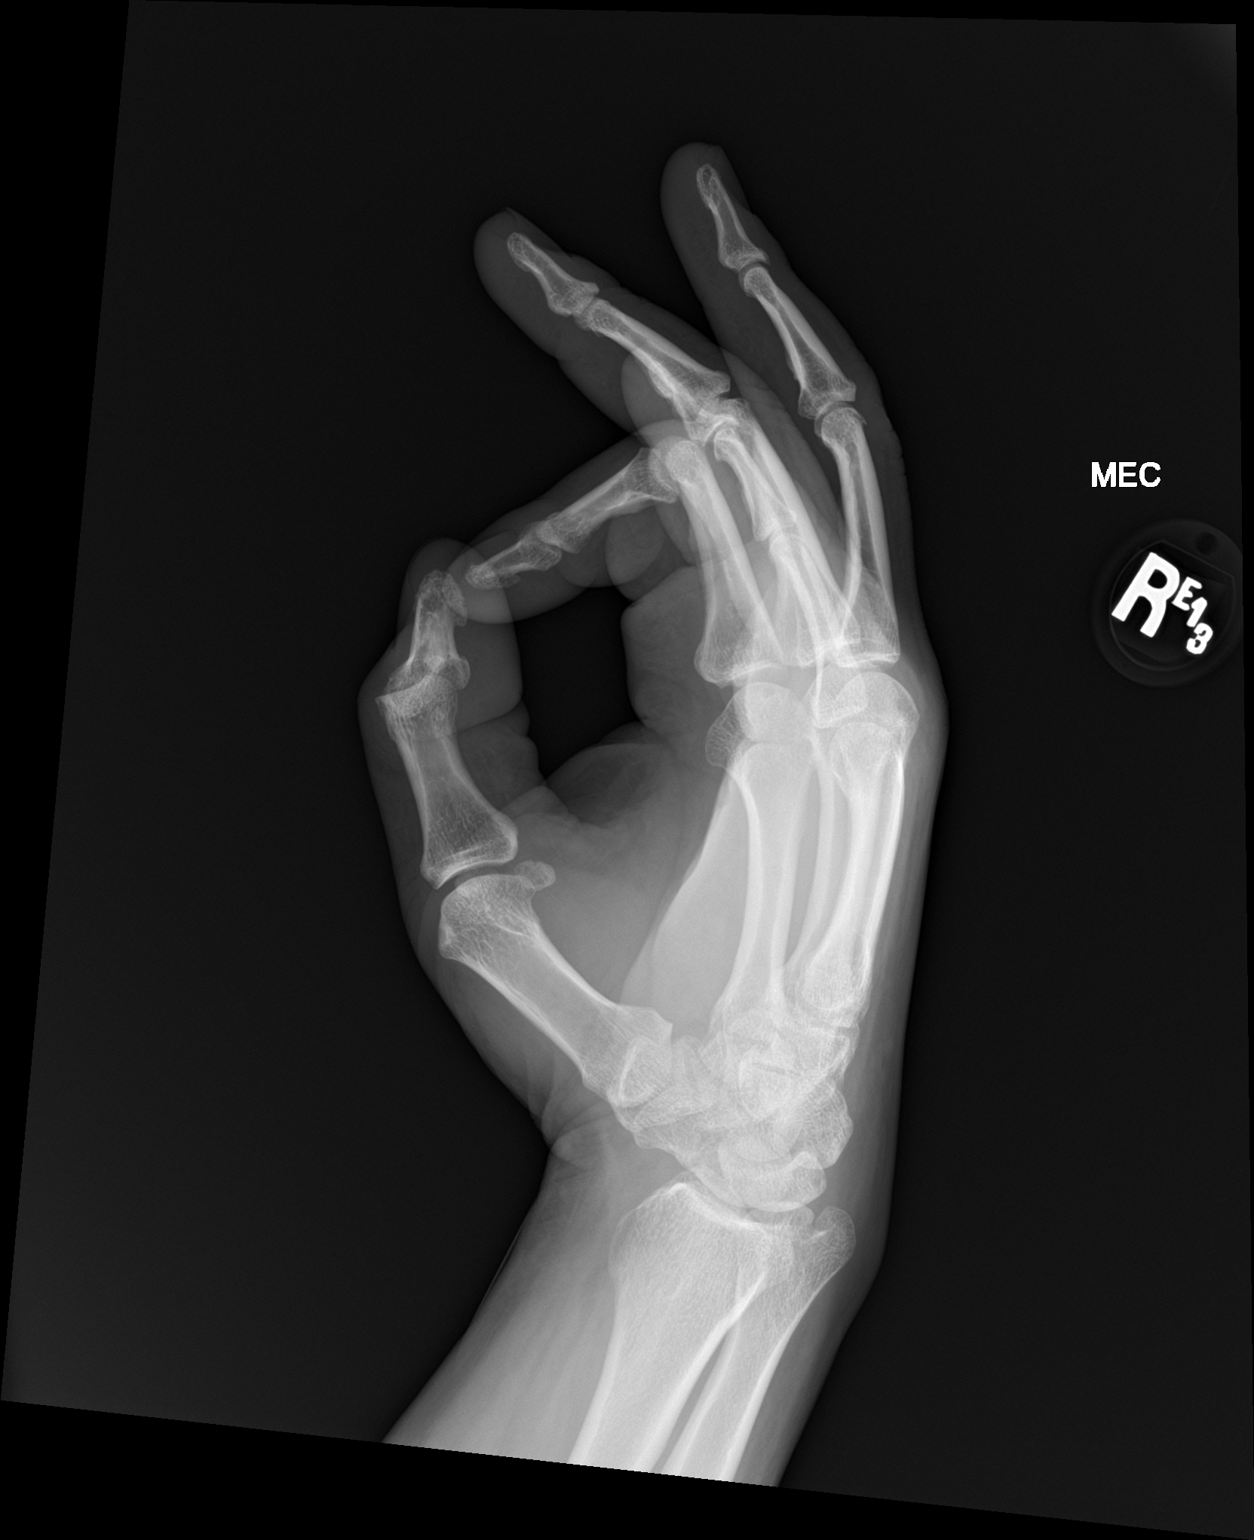

[3 of 3 positions shown; findings below may reference images not displayed]

FINDINGS: Frontal, oblique, and lateral views were obtained. There is no
appreciable acute fracture or dislocation. Evidence of old healed
fracture of the distal fifth metacarpal. There is narrowing of the
fifth DIP joint with spurring in this joint. There is mild spurring
in the first IP joint. Other joint spaces appear unremarkable. No
erosive change.
IMPRESSION: Old healed fracture distal fifth metacarpal with remodeling. No
acute fracture or dislocation evident. Osteoarthritic change noted
in the fifth DIP joint and to a lesser extent in the first IP joint.
Other joint spaces appear unremarkable.

## 2022-06-15 ENCOUNTER — Emergency Department
Admission: EM | Admit: 2022-06-15 | Discharge: 2022-06-16 | Disposition: A | Discharge status: Home / Self Care | Payer: Managed Care, Other (non HMO) | Attending: Emergency Medicine | Admitting: Emergency Medicine

## 2022-06-15 ENCOUNTER — Other Ambulatory Visit: Payer: Self-pay

## 2022-06-15 ENCOUNTER — Emergency Department: Payer: Managed Care, Other (non HMO)

## 2022-06-15 DIAGNOSIS — R1031 Right lower quadrant pain: Secondary | ICD-10-CM | POA: Insufficient documentation

## 2022-06-15 DIAGNOSIS — R918 Other nonspecific abnormal finding of lung field: Secondary | ICD-10-CM

## 2022-06-15 DIAGNOSIS — R911 Solitary pulmonary nodule: Secondary | ICD-10-CM | POA: Insufficient documentation

## 2022-06-15 LAB — COMPREHENSIVE METABOLIC PANEL
ALT: 23 U/L (ref 0–44)
AST: 20 U/L (ref 15–41)
Albumin: 4.3 g/dL (ref 3.5–5.0)
Alkaline Phosphatase: 75 U/L (ref 38–126)
Anion gap: 7 (ref 5–15)
BUN: 19 mg/dL (ref 6–20)
CO2: 27 mmol/L (ref 22–32)
Calcium: 9.2 mg/dL (ref 8.9–10.3)
Chloride: 106 mmol/L (ref 98–111)
Creatinine, Ser: 1.16 mg/dL (ref 0.61–1.24)
GFR, Estimated: >60 mL/min (ref >60)
Glucose, Bld: 108 mg/dL — ABNORMAL HIGH (ref 70–99)
Potassium: 3.9 mmol/L (ref 3.5–5.1)
Sodium: 140 mmol/L (ref 135–145)
Total Bilirubin: 0.6 mg/dL (ref 0.3–1.2)
Total Protein: 8.1 g/dL (ref 6.5–8.1)

## 2022-06-15 LAB — LIPASE, BLOOD: Lipase: 34 U/L (ref 11–51)

## 2022-06-15 LAB — CBC
HCT: 42.7 % (ref 39.0–52.0)
Hemoglobin: 14.4 g/dL (ref 13.0–17.0)
MCH: 30.7 pg (ref 26.0–34.0)
MCHC: 33.7 g/dL (ref 30.0–36.0)
MCV: 91 fL (ref 80.0–100.0)
Platelets: 263 10*3/uL (ref 150–400)
RBC: 4.69 MIL/uL (ref 4.22–5.81)
RDW: 12.4 % (ref 11.5–15.5)
WBC: 5.7 10*3/uL (ref 4.0–10.5)
nRBC: 0 % (ref 0.0–0.2)

## 2022-06-15 MED ORDER — IOHEXOL 300 MG/ML  SOLN
100.0000 mL | Freq: Once | INTRAMUSCULAR | Status: AC | PRN
  Administered 2022-06-16: 100 mL via INTRAVENOUS

## 2022-06-15 NOTE — ED Triage Notes (Signed)
Pt arrives with c/o lower ABD pain that started 2 weeks ago. Per pt, he has a hernia and it has started bother him. Pt denies n/v.

## 2022-06-15 NOTE — ED Provider Notes (Incomplete)
[facility] Provider Note    Event Date/Time   First MD Initiated Contact with Patient 06/15/22 2259     (approximate)   History   Abdominal Pain   HPI  Roy Jackson is a 51 y.o. male who reports prior history of right-sided inguinal hernia.  He presents for worsening pain over the last 2 weeks in the right lower quadrant and right groin that radiates down into his scrotum but also sometimes down his right leg.  No back pain.  No difficulty with urination.  No nausea or vomiting.  No difficulty with bowel movements.  He said that the pain is worse by the end of the day after he has spent a long time on his feet and walking around.  He can see more swelling at those times in his groin and sometimes in his scrotum.  He also said that it gets bigger and more painful when he is bearing down to have a bowel movement.  The area has not gotten hard or exquisitely painful, it has just been bothering him more than usual.  He has not had any recent traumatic injury.  He does not see a doctor or surgeon on a regular basis.     Physical Exam   Triage Vital Signs: ED Triage Vitals  Enc Vitals Group     BP 06/15/22 2051 (!) 157/96     Pulse Rate 06/15/22 2051 69     Resp 06/15/22 2051 18     Temp 06/15/22 2051 98.6 F (37 C)     Temp src --      SpO2 06/15/22 2051 96 %     Weight 06/15/22 2053 109.8 kg (242 lb)     Height --      Head Circumference --      Peak Flow --      Pain Score 06/15/22 2053 4     Pain Loc --      Pain Edu? --      Excl. in GC? --     Most recent vital signs: Vitals:   06/15/22 2051  BP: (!) 157/96  Pulse: 69  Resp: 18  Temp: 98.6 F (37 C)  SpO2: 96%     General: Awake, no distress.  He just finished eating a bag of chips while watching TV in the exam room. CV:  Good peripheral perfusion.  Resp:  Normal effort.  Abd:  No distention.  No tenderness to palpation of the abdomen.  He has some tenderness to palpation of  the right groin and the right side of his scrotum but no palpable deformity, no palpable swelling, no indurated or firm areas suggestive of a strangulated or incarcerated hernia.  No scrotal edema or penile abnormality.  No cellulitis nor crepitus of the scrotum. Other:  Mood and affect are normal and appropriate under the circumstances.   ED Results / Procedures / Treatments   Labs (all labs ordered are listed, but only abnormal results are displayed) Labs Reviewed  COMPREHENSIVE METABOLIC PANEL - Abnormal; Notable for the following components:      Result Value   Glucose, Bld 108 (*)    All other components within normal limits  LIPASE, BLOOD  CBC  URINALYSIS, ROUTINE W REFLEX MICROSCOPIC     RADIOLOGY I viewed and interpreted the patient's CT of the abdomen and pelvis with IV contrast.  ***    PROCEDURES:  Critical Care performed: No  Procedures   MEDICATIONS ORDERED  IN ED: Medications - No data to display   IMPRESSION / MDM / ASSESSMENT AND PLAN / ED COURSE  I reviewed the triage vital signs and the nursing notes.                              Differential diagnosis includes, but is not limited to, inguinal hernia with intermittent exacerbation, SBO/ileus, UTI/pyelonephritis, STD, orchitis, epididymitis, testicular torsion.  Patient's presentation is most consistent with exacerbation of chronic illness.  Vital signs are stable and within normal limits.  Patient appears quite comfortable.  He has no systemic signs or symptoms.  Interestingly he did not tell me that he has ever had surgery before, but "hernia surgery" is listed on his past medical history.  He is a bit vague with his history and his current symptoms but it seems that he has been having more issues with a right sided inguinal hernia over the last 2 weeks.  There is no indication that he is currently strangulated or incarcerated, but it may be that he has a chronic hernia and/or that he had a prior repair  that has failed and he is now having intermittent symptoms that are worse after exertion or at the end of the day.  We talked about it and agreed to proceed with a CT of the abdomen and pelvis to further evaluate and to rule out the possibility of an emergent surgical issue tonight even though I think that the risk is quite low.  He does not need any analgesia at this time.  Anticipate ruling out an emergent issue and providing him with my usual customary management recommendations and follow-up plan for inguinal hernia.  He agrees.  I ordered the CT scan.  Other labs/studies ordered include CMP, lipase, CBC, and urinalysis.  Urinalysis is still pending, all of his lab work is within normal limits.      FINAL CLINICAL IMPRESSION(S) / ED DIAGNOSES   Final diagnoses:  None     Rx / DC Orders   ED Discharge Orders     None        Note:  This document was prepared using Dragon voice recognition software and may include unintentional dictation errors.

## 2022-06-15 NOTE — ED Provider Notes (Signed)
[facility] Provider Note    Event Date/Time   First MD Initiated Contact with Patient 06/15/22 2259     (approximate)   History   Abdominal Pain   HPI {Remember to add pertinent medical, surgical, social, and/or OB history to HPI:1} Roy Jackson is a 51 y.o. male  ***       Physical Exam   Triage Vital Signs: ED Triage Vitals  Enc Vitals Group     BP 06/15/22 2051 (!) 157/96     Pulse Rate 06/15/22 2051 69     Resp 06/15/22 2051 18     Temp 06/15/22 2051 98.6 F (37 C)     Temp src --      SpO2 06/15/22 2051 96 %     Weight 06/15/22 2053 109.8 kg (242 lb)     Height --      Head Circumference --      Peak Flow --      Pain Score 06/15/22 2053 4     Pain Loc --      Pain Edu? --      Excl. in GC? --     Most recent vital signs: Vitals:   06/15/22 2051  BP: (!) 157/96  Pulse: 69  Resp: 18  Temp: 98.6 F (37 C)  SpO2: 96%    {Only need to document appropriate and relevant physical exam:1} General: Awake, no distress. *** CV:  Good peripheral perfusion. *** Resp:  Normal effort. *** Abd:  No distention. *** Other:  ***   ED Results / Procedures / Treatments   Labs (all labs ordered are listed, but only abnormal results are displayed) Labs Reviewed  COMPREHENSIVE METABOLIC PANEL - Abnormal; Notable for the following components:      Result Value   Glucose, Bld 108 (*)    All other components within normal limits  LIPASE, BLOOD  CBC  URINALYSIS, ROUTINE W REFLEX MICROSCOPIC     EKG  ***   RADIOLOGY *** {USE THE WORD "INTERPRETED"!! You MUST document your own interpretation of imaging, as well as the fact that you reviewed the radiologist's report!:1}   PROCEDURES:  Critical Care performed: {CriticalCareYesNo:19197::"Yes, see critical care procedure note(s)","No"}  Procedures   MEDICATIONS ORDERED IN ED: Medications - No data to display   IMPRESSION / MDM / ASSESSMENT AND PLAN / ED COURSE  I  reviewed the triage vital signs and the nursing notes.                              Differential diagnosis includes, but is not limited to, ***  Patient's presentation is most consistent with {EM COPA:27473}  {If the patient is on the monitor, remove the brackets and asterisks on the sentence below and remember to document it as a Procedure as well. Otherwise delete the sentence below:1} {**The patient is on the cardiac monitor to evaluate for evidence of arrhythmia and/or significant heart rate changes.**} {Remember to include, when applicable, any/all of the following data: independent review of imaging independent review of labs (comment specifically on pertinent positives and negatives) review of specific prior hospitalizations, PCP/specialist notes, etc. discuss meds given and prescribed document any discussion with consultants (including hospitalists) any clinical decision tools you used and why (PECARN, NEXUS, etc.) did you consider admitting the patient? document social determinants of health affecting patient's care (homelessness, inability to follow up in a timely fashion, etc) document any  pre-existing conditions increasing risk on current visit (e.g. diabetes and HTN increasing danger of high-risk chest pain/ACS) describes what meds you gave (especially parenteral) and why any other interventions?:1}     FINAL CLINICAL IMPRESSION(S) / ED DIAGNOSES   Final diagnoses:  None     Rx / DC Orders   ED Discharge Orders     None        Note:  This document was prepared using Dragon voice recognition software and may include unintentional dictation errors.

## 2022-06-16 LAB — URINALYSIS, ROUTINE W REFLEX MICROSCOPIC
Bacteria, UA: NONE SEEN
Bilirubin Urine: NEGATIVE
Glucose, UA: NEGATIVE mg/dL
Ketones, ur: NEGATIVE mg/dL
Leukocytes,Ua: NEGATIVE
Nitrite: NEGATIVE
Protein, ur: NEGATIVE mg/dL
Specific Gravity, Urine: 1.025 (ref 1.005–1.030)
pH: 5 (ref 5.0–8.0)

## 2022-06-16 NOTE — Discharge Instructions (Addendum)
You have been seen in the Emergency Department (ED) for groin and/or abdominal pain.  Your evaluation did not identify a clear cause of your symptoms but was generally reassuring.  Incidentally, they saw some pulmonary nodules (small nodules on your lungs) at the top of the images of your abdomen.  These are frequently seen on scans, and do not mean that anything in particular is wrong with you, particularly since you are having no respiratory symptoms.  However, we put in a referral to the pulmonary nodule clinic.  Someone in this clinic should reach out to you, and we strongly encourage you to follow-up with them to make sure that nothing is changing and that you need no treatment in the long-term.  Please follow up as instructed above regarding today's emergent visit and the symptoms that are bothering you.  Return to the ED if your abdominal pain worsens or fails to improve, you develop bloody vomiting, bloody diarrhea, you are unable to tolerate fluids due to vomiting, fever greater than 101, or other symptoms that concern you.

## 2022-06-30 ENCOUNTER — Institutional Professional Consult (permissible substitution): Payer: Managed Care, Other (non HMO) | Admitting: Student in an Organized Health Care Education/Training Program

## 2022-07-07 ENCOUNTER — Ambulatory Visit: Payer: Self-pay | Admitting: Surgery

## 2022-08-20 ENCOUNTER — Encounter: Payer: Self-pay | Admitting: Internal Medicine

## 2022-08-31 ENCOUNTER — Emergency Department: Payer: Self-pay

## 2022-08-31 ENCOUNTER — Other Ambulatory Visit: Payer: Self-pay

## 2022-08-31 ENCOUNTER — Emergency Department
Admission: EM | Admit: 2022-08-31 | Discharge: 2022-08-31 | Discharge status: Against Medical Advice | Payer: Self-pay | Attending: Emergency Medicine | Admitting: Emergency Medicine

## 2022-08-31 DIAGNOSIS — R1110 Vomiting, unspecified: Secondary | ICD-10-CM | POA: Insufficient documentation

## 2022-08-31 DIAGNOSIS — R519 Headache, unspecified: Secondary | ICD-10-CM | POA: Insufficient documentation

## 2022-08-31 DIAGNOSIS — Z5321 Procedure and treatment not carried out due to patient leaving prior to being seen by health care provider: Secondary | ICD-10-CM | POA: Insufficient documentation

## 2022-08-31 DIAGNOSIS — R079 Chest pain, unspecified: Secondary | ICD-10-CM | POA: Insufficient documentation

## 2022-08-31 LAB — BASIC METABOLIC PANEL
Anion gap: 8 (ref 5–15)
BUN: 11 mg/dL (ref 6–20)
CO2: 25 mmol/L (ref 22–32)
Calcium: 9.3 mg/dL (ref 8.9–10.3)
Chloride: 108 mmol/L (ref 98–111)
Creatinine, Ser: 0.92 mg/dL (ref 0.61–1.24)
GFR, Estimated: >60 mL/min (ref >60)
Glucose, Bld: 114 mg/dL — ABNORMAL HIGH (ref 70–99)
Potassium: 3.1 mmol/L — ABNORMAL LOW (ref 3.5–5.1)
Sodium: 141 mmol/L (ref 135–145)

## 2022-08-31 LAB — CBC
HCT: 41.8 % (ref 39.0–52.0)
Hemoglobin: 14.4 g/dL (ref 13.0–17.0)
MCH: 30.6 pg (ref 26.0–34.0)
MCHC: 34.4 g/dL (ref 30.0–36.0)
MCV: 88.7 fL (ref 80.0–100.0)
Platelets: 270 10*3/uL (ref 150–400)
RBC: 4.71 MIL/uL (ref 4.22–5.81)
RDW: 12.5 % (ref 11.5–15.5)
WBC: 7.7 10*3/uL (ref 4.0–10.5)
nRBC: 0 % (ref 0.0–0.2)

## 2022-08-31 LAB — TROPONIN I (HIGH SENSITIVITY): Troponin I (High Sensitivity): 6 ng/L (ref <18)

## 2022-08-31 NOTE — ED Triage Notes (Signed)
Pt to ED by self for CP, headache and vomiting x 1 week. Pt has taken OTC meds at home with no relief. Pt is CAOx4 and in no acute distress.

## 2022-08-31 NOTE — ED Notes (Signed)
No answer when called for repeat labs & VS.

## 2022-08-31 NOTE — ED Notes (Signed)
No answer when called for repeat labs & VS. 

## 2022-11-05 ENCOUNTER — Other Ambulatory Visit: Payer: Self-pay

## 2022-11-05 ENCOUNTER — Emergency Department
Admission: EM | Admit: 2022-11-05 | Discharge: 2022-11-05 | Disposition: A | Discharge status: Home / Self Care | Payer: BC Managed Care – PPO | Attending: Emergency Medicine | Admitting: Emergency Medicine

## 2022-11-05 DIAGNOSIS — Z20822 Contact with and (suspected) exposure to covid-19: Secondary | ICD-10-CM | POA: Diagnosis not present

## 2022-11-05 DIAGNOSIS — R519 Headache, unspecified: Secondary | ICD-10-CM | POA: Insufficient documentation

## 2022-11-05 DIAGNOSIS — G8929 Other chronic pain: Secondary | ICD-10-CM | POA: Diagnosis not present

## 2022-11-05 LAB — RESP PANEL BY RT-PCR (RSV, FLU A&B, COVID)  RVPGX2
Influenza A by PCR: NEGATIVE
Influenza B by PCR: NEGATIVE
Resp Syncytial Virus by PCR: NEGATIVE
SARS Coronavirus 2 by RT PCR: NEGATIVE

## 2022-11-05 NOTE — ED Notes (Signed)
Pt to ED for covid test, requested by his work. Pt has unlabored respirations and steady gait. No acute distress.

## 2022-11-05 NOTE — Discharge Instructions (Signed)
Monitor symptoms for covid- if worsening symptoms and test is negative today then you will need retest given your close covid contact.  Follow up in mychart for your results

## 2022-11-05 NOTE — ED Triage Notes (Signed)
Presents with C/O headache.  States job sent him to have COVID test because everyone in his household have tested positive.  AAOx3.  Skin warm and dry. NAD

## 2022-11-05 NOTE — ED Notes (Signed)
Provider saw pt at bedside.

## 2022-11-05 NOTE — ED Provider Notes (Signed)
[facility] Provider Note    Event Date/Time   First MD Initiated Contact with Patient 11/05/22 0827     (approximate)   History   No chief complaint on file.   HPI  Roy Jackson is a 52 y.o. male with history of chronic headaches, OSA who comes in with concerns for headache.  Patient reports having a history of chronic migraines.  He reports that this current headache is very minimal at 2 out of 10.  He reports a slight tickle in his throat that he will intermittently get.  He states that he had this tickle for about 3 days now.  He denies any coughing shortness of breath, fevers or other concerns.  He states that his family member including his wife and son have [place] and that his work wanted him to get a COVID test before returning to work.  He denies any vision changes with his headaches or concerns of hitting his head or any other concerns   Physical Exam   Triage Vital Signs: ED Triage Vitals  Enc Vitals Group     BP 11/05/22 0814 (!) 159/90     Pulse Rate 11/05/22 0814 66     Resp 11/05/22 0814 18     Temp 11/05/22 0814 98.3 F (36.8 C)     Temp Source 11/05/22 0814 Oral     SpO2 11/05/22 0814 99 %     Weight 11/05/22 0814 240 lb 1.3 oz (108.9 kg)     Height 11/05/22 0814 5\' 9"  (1.753 m)     Head Circumference --      Peak Flow --      Pain Score 11/05/22 0814 2     Pain Loc --      Pain Edu? --      Excl. in [place]? --     Most recent vital signs: Vitals:   11/05/22 0814  BP: (!) 159/90  Pulse: 66  Resp: 18  Temp: 98.3 F (36.8 C)  SpO2: 99%     General: Awake, no distress.  CV:  Good peripheral perfusion.  Resp:  Normal effort.  Abd:  No distention.  Other:  Cranial nerves intact.  Equal strength in arms and legs.  OP is clear.  TMs clear   ED Results / Procedures / Treatments   Labs (all labs ordered are listed, but only abnormal results are displayed) Labs Reviewed  RESP PANEL BY RT-PCR (RSV, FLU A&B, COVID)  [id number]      PROCEDURES:  Critical Care performed: No  Procedures   MEDICATIONS ORDERED IN ED: Medications - No data to display   IMPRESSION / MDM / [place] / ED COURSE  I reviewed the triage vital signs and the nursing notes.   Patient's presentation is most consistent with acute, uncomplicated illness.   Patient comes in with minimal symptoms of a headache that seems more chronic patient may be a slight tickle in throat no evidence of strep throat on examination.  Declines anything for the headache.  No concern for trauma to the head or blurry vision to suggest other acute pathology.  No indication for CT imaging given he reports these off-and-on headaches for a long time.    We discussed COVID, flu testing.  He does not want to stay for results but will see if he can get it in my chart if not he will stay for the results so he can provide it for work.  We  discussed quarantine if COVID test is positive.  He reports that he is day 3 of the slight tickle.  We discussed antiviral therapy but patient declined.  Patient is not vaccinated understands that it can decrease hospitalization but reports having COVID previously and doing okay and not being interested in the antiviral medications today.   We discussed wearing a mask at work given he has known [place] contacts, not taking his mask off to eat, washing his hands etc. we discussed that if his symptoms were getting worse and his COVID test today was negative that he would need to have a retest and remain quarantine until results came back he expressed understanding.  We discussed that there is always a slight chance that he could have COVID with a negative test but the chance for infectivity is low.   FINAL CLINICAL IMPRESSION(S) / ED DIAGNOSES   Final diagnoses:  Contact with and (suspected) exposure to covid-19  Chronic intractable headache, unspecified headache type     Rx / DC Orders   ED Discharge Orders     None         Note:  This document was prepared using Dragon voice recognition software and may include unintentional dictation errors.   Vanessa Marvell, MD 11/05/22 [phone]

## 2023-04-02 ENCOUNTER — Emergency Department (HOSPITAL_COMMUNITY)
Admission: EM | Admit: 2023-04-02 | Discharge: 2023-04-02 | Disposition: A | Discharge status: Other Acute Inpatient Hospital | Payer: No Payment, Other | Attending: Emergency Medicine | Admitting: Emergency Medicine

## 2023-04-02 ENCOUNTER — Other Ambulatory Visit: Payer: Self-pay

## 2023-04-02 ENCOUNTER — Encounter (HOSPITAL_COMMUNITY): Payer: Self-pay

## 2023-04-02 ENCOUNTER — Ambulatory Visit (HOSPITAL_COMMUNITY)
Admission: EM | Admit: 2023-04-02 | Discharge: 2023-04-02 | Disposition: A | Discharge status: Other Acute Inpatient Hospital | Payer: No Payment, Other | Attending: Urology | Admitting: Urology

## 2023-04-02 DIAGNOSIS — I10 Essential (primary) hypertension: Secondary | ICD-10-CM | POA: Diagnosis not present

## 2023-04-02 DIAGNOSIS — F332 Major depressive disorder, recurrent severe without psychotic features: Secondary | ICD-10-CM | POA: Insufficient documentation

## 2023-04-02 DIAGNOSIS — F29 Unspecified psychosis not due to a substance or known physiological condition: Secondary | ICD-10-CM | POA: Diagnosis not present

## 2023-04-02 DIAGNOSIS — T1491XA Suicide attempt, initial encounter: Secondary | ICD-10-CM

## 2023-04-02 DIAGNOSIS — R799 Abnormal finding of blood chemistry, unspecified: Secondary | ICD-10-CM | POA: Insufficient documentation

## 2023-04-02 DIAGNOSIS — Z9151 Personal history of suicidal behavior: Secondary | ICD-10-CM | POA: Insufficient documentation

## 2023-04-02 DIAGNOSIS — R791 Abnormal coagulation profile: Secondary | ICD-10-CM | POA: Insufficient documentation

## 2023-04-02 DIAGNOSIS — T50902A Poisoning by unspecified drugs, medicaments and biological substances, intentional self-harm, initial encounter: Secondary | ICD-10-CM | POA: Insufficient documentation

## 2023-04-02 DIAGNOSIS — X838XXA Intentional self-harm by other specified means, initial encounter: Secondary | ICD-10-CM | POA: Insufficient documentation

## 2023-04-02 LAB — RAPID URINE DRUG SCREEN, HOSP PERFORMED
Amphetamines: NOT DETECTED
Barbiturates: NOT DETECTED
Benzodiazepines: NOT DETECTED
Cocaine: NOT DETECTED
Opiates: NOT DETECTED
Tetrahydrocannabinol: NOT DETECTED

## 2023-04-02 LAB — CBC WITH DIFFERENTIAL/PLATELET
Abs Immature Granulocytes: 0 10*3/uL (ref 0.00–0.07)
Basophils Absolute: 0 10*3/uL (ref 0.0–0.1)
Basophils Relative: 0 %
Eosinophils Absolute: 0 10*3/uL (ref 0.0–0.5)
Eosinophils Relative: 0 %
HCT: 42.5 % (ref 39.0–52.0)
Hemoglobin: 14.4 g/dL (ref 13.0–17.0)
Immature Granulocytes: 0 %
Lymphocytes Relative: 27 %
Lymphs Abs: 1.4 10*3/uL (ref 0.7–4.0)
MCH: 31.2 pg (ref 26.0–34.0)
MCHC: 33.9 g/dL (ref 30.0–36.0)
MCV: 92.2 fL (ref 80.0–100.0)
Monocytes Absolute: 0.3 10*3/uL (ref 0.1–1.0)
Monocytes Relative: 6 %
Neutro Abs: 3.5 10*3/uL (ref 1.7–7.7)
Neutrophils Relative %: 67 %
Platelets: 257 10*3/uL (ref 150–400)
RBC: 4.61 MIL/uL (ref 4.22–5.81)
RDW: 12.9 % (ref 11.5–15.5)
WBC: 5.2 10*3/uL (ref 4.0–10.5)
nRBC: 0 % (ref 0.0–0.2)

## 2023-04-02 LAB — COMPREHENSIVE METABOLIC PANEL
ALT: 22 U/L (ref 0–44)
AST: 21 U/L (ref 15–41)
Albumin: 4.1 g/dL (ref 3.5–5.0)
Alkaline Phosphatase: 61 U/L (ref 38–126)
Anion gap: 9 (ref 5–15)
BUN: <5 mg/dL — ABNORMAL LOW (ref 6–20)
CO2: 26 mmol/L (ref 22–32)
Calcium: 8.9 mg/dL (ref 8.9–10.3)
Chloride: 102 mmol/L (ref 98–111)
Creatinine, Ser: 1.09 mg/dL (ref 0.61–1.24)
GFR, Estimated: >60 mL/min (ref >60)
Glucose, Bld: 95 mg/dL (ref 70–99)
Potassium: 3.1 mmol/L — ABNORMAL LOW (ref 3.5–5.1)
Sodium: 137 mmol/L (ref 135–145)
Total Bilirubin: 0.7 mg/dL (ref 0.3–1.2)
Total Protein: 7.5 g/dL (ref 6.5–8.1)

## 2023-04-02 LAB — ACETAMINOPHEN LEVEL
Acetaminophen (Tylenol), Serum: <10 ug/mL — ABNORMAL LOW (ref 10–30)
Acetaminophen (Tylenol), Serum: <10 ug/mL — ABNORMAL LOW (ref 10–30)

## 2023-04-02 LAB — SALICYLATE LEVEL: Salicylate Lvl: <7 mg/dL — ABNORMAL LOW (ref 7.0–30.0)

## 2023-04-02 LAB — MAGNESIUM: Magnesium: 2.3 mg/dL (ref 1.7–2.4)

## 2023-04-02 LAB — CBG MONITORING, ED: Glucose-Capillary: 97 mg/dL (ref 70–99)

## 2023-04-02 LAB — ETHANOL: Alcohol, Ethyl (B): <10 mg/dL (ref <10)

## 2023-04-02 LAB — PROTIME-INR
INR: 1.1 (ref 0.8–1.2)
Prothrombin Time: 14 seconds (ref 11.4–15.2)

## 2023-04-02 MED ORDER — POTASSIUM CHLORIDE CRYS ER 20 MEQ PO TBCR
40.0000 meq | EXTENDED_RELEASE_TABLET | Freq: Once | ORAL | Status: AC
  Administered 2023-04-02: 40 meq via ORAL
  Filled 2023-04-02: qty 2

## 2023-04-02 NOTE — Progress Notes (Signed)
Pt is under review at [facility] per [facility] Brook McNichol,RN TODAY6/27/2024 PENDING recheck vitals and CIWA.  Pt meets inpatient criteria per Shearon Stalls  Attending Physician will be Dr. Phineas Inches, MD   Report can be called to:-Adult unit: [phone]  Pt can arrive after: DO NOT CALL REPORT OR SEND PT UNTIL CONFIRMED BY [facility]. PT IS CURRENTLY UNDER REVIEW PENDING REQUESTED ITEMS.  Care Team notified:[facility], Terry Baileyton, Turkey Kingsley,DO, Lanora Manis [address]   [place], [place] 04/02/2023 @ 12:48 PM

## 2023-04-02 NOTE — ED Notes (Signed)
Pt remains tearful and solemn.

## 2023-04-02 NOTE — ED Notes (Signed)
Pt has been "acting out" by sitting on the floor next to the bed while pressing the buttons on the bed to make it go up and down, up and down etc. Bed controls placed in a position so he could no longer do this. Then patient would try at get behind the bed and mess with the different cables. Pt would be asked not to do this and he would comply, but continues to just stand next to the bed blankly looking at the bed. Pt will respond when spoken to but with very minimal words and a shake of the head.

## 2023-04-02 NOTE — ED Notes (Signed)
I retrieved pt's cell phone from security so that he could get a couple of numbers from Contacts. Phone returned to security and correct steps made with security to re-secure valuables.

## 2023-04-02 NOTE — ED Notes (Signed)
Pt visibly upset sitting in room, requested to walk around unit. Ambulated accompanied by sitter and security

## 2023-04-02 NOTE — ED Notes (Signed)
PT IVC paperwork now completed, RE-UPLOADED to efile system for the acquiring of Case Number [id number]. IVC paperwork not on clipboard so I made copies added them to clipboard in purple and medrec drawer and faxed to [facility] EXP:04/09/23

## 2023-04-02 NOTE — ED Notes (Signed)
Report given to [facility] [email or web address]  [place] charge nurse

## 2023-04-02 NOTE — ED Notes (Signed)
[facility] and [facility] has been called for transport

## 2023-04-02 NOTE — Consult Note (Addendum)
BH ED ASSESSMENT   Reason for Consult:  Psych Consult Referring Physician:   Rexford Maus, DO   Patient Identification: Roy Jackson MRN:  [id number] ED Chief Complaint: Suicide attempt [facility])  Diagnosis:  Principal Problem:   Suicide attempt [facility]) Active Problems:   MDD (major depressive disorder), recurrent severe, without psychosis (HCC)   ED Assessment Time Calculation: Start Time: 1050 Stop Time: 1126 Total Time in Minutes (Assessment Completion): 36   Subjective:    Roy Jackson is a 52 y.o. AA male with a past psychiatric history of MDD and IED as an adolescent, with pertinent medical comorbidities/history that include hypogonadism, obesity, tobacco use, inguinal hernia, syphilis (treated), vitamin D deficiency, OSA, chronic headaches, and paresthesia of bilateral hands and right leg, who presented initially to the [employer] [facility] by way of law enforcement due to attempted suicide attempt where the patient was found at home with a gun in his room, and after he had consumed ETOH, bleach, OTC medications, and pest control poison, who was subsequently taken from [facility] and transferred to [facility] emergency department for medical clearance and evaluation by way of EMS. Patient currently is medically cleared per EDP team, as well as is notably involuntary committed at this time.  HPI:    Patient seen today at [facility] for face-to-face evaluation.  Upon evaluation, patient initially is very limited in his ability to participate, spends about 5 minutes unable to make himself stop crying and expressing a desire to leave, as well as regret about his recent suicide attempt.  After being able to calm down, patient participates with this writer while still somewhat tearful, with no eye contact, and intermittent sobbing, but he is notably in no physical distress.   Patient tells me that he regrets the events that recently transpired, reports that if he would have just utilized coping  skills that have worked over the last 40 years, that he would have never ended up in the situation. He tells me that normally when he is going through a depressive episode and really stressed out, that he does one of two things, he goes to a waterfall nearby and talks to his deceased grandma in prayer, and/or he goes in the backyard and looks at the stars and does deep breathing. He endorses that he did neither of these effective coping skills that he has utilized over the years, because he listened to the advice of two of his new friends from his automotive hobby group that he recently joined, who told him to just go in the house and watch TV and just try to distract himself. He reports that just prior to attempting to end his life that he and his two new friends were having a "venting session" and some really difficult things were being discussed from the past, which led to him impulsively ingesting a cup of bleach, pesticide roach killer, over-the-counter sleeping medications, copious amounts of alcohol, and grabbing a loaded firearm, though he denies the firearm was to be used to shoot himself, does not deny the other aforementioned were not being utilized to end his life.   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 He reports currently no suicidal ideations, intent, or plan, but does endorse he continues to have access to means, forwards he has a variety of firearms still present in the home, as well as the aforementioned chemicals,  poisons, and medications, as well as notably he is separated physically from his wife and living alone. He denies any homicidal ideations. He denies any auditory visual hallucinations, and objectively, does not present with any delusional themes, paranoia, and/or psychotic features. He endorses a past psychiatric history of depression and IED since the age of 11 years. He endorses on top of current psychosocial stressors that he has struggled over the years from a lot of traumatic things that have happened; tells me father was murdered for drug dealing, mother abandoned him and took all of his inheritance he was to receive from his father's murder, and that he was forced to go live with his grandparents. He endorsed a history of participating in therapy from the ages of around 66-3 years of age, and trying a few medications he cannot recall, but no hospitalizations or formal psychiatric care in the past and or currently, endorses primary care provided all of his historical psychiatric care (I.e., MDD, IED).  He endorses 2 previous suicide attempts, one at the age of 52 years of age via attempted drowning, and another attempt at the age of 52 by "walking into a red neck bar and fighting with everyone I could get my hands on."  He endorses adamantly that hospitalization will only make things worse and that "it will cause me to need to be institutionalized."  His orientation is intact.  He denies any drug use and/or EtOH use currently, but does endorse smoking marijuana when he was an adolescent.  He endorses current tobacco use, endorses he smokes to "relieve stress."  He endorses poor sleep, poor appetite, increased irritability, and decreased a desire in his life.  Collateral Idamae Lusher) [phone] / [phone]  Call placed to friend for collateral information.  Friend reports that he initially became very concerned about his friend starting last Friday, reports that the patient had reached out to him  stating that he wanted his friend to take all of his guns out of his home for fear of his safety, but after his friend came over and they played video games and talk for a long time, the patient changed his mind and did not want him to take his firearms.  He states that the patient has been going through a lot of psychosocial stressors, shares that the patient has told him that his marital conflict has been very hard on him, his dog just recently died, and that he has had a lot of death in his family lately, one of them notably being his grandma who he was incredibly close with.  He reports the night prior to admission around approximately 8:30 PM that the patient texted him "bro I love you, never forget that", which made him severely concerned to the point where he called law enforcement to check on the patient, which led to subsequent involuntary commitment and being brought to the hospital.  He expresses severe concern for his friends safety, reports that he has gone over to his friend's house and collected all of his firearms and dangerous objects, and looks forward to keeping a close eye on  him in combination with his wife upon discharge.  Collateral (Wife, Sherri) [phone] [phone] no answer  Past Psychiatric History: MDD and IED  Risk to Self or Others: Is the patient at risk to self? Yes Has the patient been a risk to self in the past 6 months? No Has the patient been a risk to self within the distant past? Yes Is the patient a risk to others? No Has the patient been a risk to others in the past 6 months? No Has the patient been a risk to others within the distant past? No  [place] Scale:  Flowsheet Row ED from 04/02/2023 in [facility] Emergency Department at [facility] Most recent reading at 04/02/2023 10:33 AM ED from 04/02/2023 in [facility] Most recent reading at 04/02/2023  1:53 AM ED from 11/05/2022 in [facility] Emergency Department at [facility] Most recent reading at 11/05/2022  8:15 AM  C-SSRS RISK CATEGORY High Risk High Risk No Risk       Substance Abuse:  THC ("twenty years ago")  Past Medical History:  Past Medical History:  Diagnosis Date   Chronic headaches    History of hypogonadism 03/28/2019   OSA (obstructive sleep apnea) 11/12/2017   Per sleep study 10/2017    Past Surgical History:  Procedure Laterality Date   HERNIA REPAIR     TONSILLECTOMY     Family History:  Family History  Problem Relation Age of Onset   Heart attack Other        maternal GF died in 35s, maternal uncles x 2 in 85s    Hypertension Paternal Grandmother    Hypertension Paternal Grandfather    Diabetes Paternal Grandfather    Family Psychiatric  History: None endorsed Social History:  Social History   Substance and Sexual Activity  Alcohol Use Not Currently   Comment: occ     Social History   Substance and Sexual Activity  Drug Use No    Social History   Socioeconomic History   Marital status: Married    Spouse name: Not on file   Number of children: 2   Years of education: 12   Highest education level: Not on file  Occupational History   Not on file  Tobacco Use   Smoking status: Former    Types: Cigarettes   Smokeless tobacco: Never   Tobacco comments:    last one 2-3 months ago  Vaping Use   Vaping Use: Never used  Substance and Sexual Activity   Alcohol use: Not Currently    Comment: occ   Drug use: No   Sexual activity: Not Currently    Partners: Female  Other Topics Concern   Not on file  Social History Narrative   Smoking 1/2 pk per week    leave wife   2-story home   Soda 1-2 a day   11th grade   Social Determinants of Health   Financial Resource Strain: Not on file  Food Insecurity: Not on file  Transportation Needs: Not on file  Physical Activity: Not on file  Stress: Not on file  Social Connections: Not on file   Additional Social History:    Allergies:   Allergies   Allergen Reactions   Darvocet [Propoxyphene N-Acetaminophen] Other (See Comments)    unknown   Penicillins Swelling    Pt states he has had amoxicillin several times without issues, and has bicillin without any problems. Pt states his PCP said he "probably  grew out of it."   Sulfa Antibiotics Other (See Comments)    unkown    Labs:  Results for orders placed or performed during the hospital encounter of 04/02/23 (from the past 48 hour(s))  Acetaminophen level     Status: Abnormal   Collection Time: 04/02/23  3:33 AM  Result Value Ref Range   Acetaminophen (Tylenol), Serum <10 (L) 10 - 30 ug/mL    Comment: (NOTE) Therapeutic concentrations vary significantly. A range of 10-30 ug/mL  may be an effective concentration for many patients. However, some  are best treated at concentrations outside of this range. Acetaminophen concentrations >150 ug/mL at 4 hours after ingestion  and >50 ug/mL at 12 hours after ingestion are often associated with  toxic reactions.  Performed at [facility], [address] [place], [place] [address]   Salicylate level     Status: Abnormal   Collection Time: 04/02/23  3:33 AM  Result Value Ref Range   Salicylate Lvl <7.0 (L) 7.0 - 30.0 mg/dL    Comment: Performed at [facility], [address], [place], [place] [address]  Ethanol     Status: None   Collection Time: 04/02/23  3:33 AM  Result Value Ref Range   Alcohol, Ethyl (B) <10 <10 mg/dL    Comment: (NOTE) Lowest detectable limit for serum alcohol is 10 mg/dL.  For medical purposes only. Performed at [facility], [address] [place], [place] [address]   Comprehensive metabolic panel     Status: Abnormal   Collection Time: 04/02/23  3:33 AM  Result Value Ref Range   Sodium 137 135 - 145 mmol/L   Potassium 3.1 (L) 3.5 - 5.1 mmol/L   Chloride 102 98 - 111 mmol/L   CO2 26 22 - 32 mmol/L   Glucose, Bld 95 70 - 99 mg/dL    Comment: Glucose reference range applies only  to samples taken after fasting for at least 8 hours.   BUN <5 (L) 6 - 20 mg/dL   Creatinine, Ser 9.14 0.61 - 1.24 mg/dL   Calcium 8.9 8.9 - 78.2 mg/dL   Total Protein 7.5 6.5 - 8.1 g/dL   Albumin 4.1 3.5 - 5.0 g/dL   AST 21 15 - 41 U/L   ALT 22 0 - 44 U/L   Alkaline Phosphatase 61 38 - 126 U/L   Total Bilirubin 0.7 0.3 - 1.2 mg/dL   GFR, Estimated >95 >62 mL/min    Comment: (NOTE) Calculated using the CKD-EPI Creatinine Equation (2021)    Anion gap 9 5 - 15    Comment: Performed at [facility], [address], [place], [place] [address]  CBC with Differential     Status: None   Collection Time: 04/02/23  3:33 AM  Result Value Ref Range   WBC 5.2 4.0 - 10.5 K/uL   RBC 4.61 4.22 - 5.81 MIL/uL   Hemoglobin 14.4 13.0 - 17.0 g/dL   HCT 57.8 46.9 - 62.9 %   MCV 92.2 80.0 - 100.0 fL   MCH 31.2 26.0 - 34.0 pg   MCHC 33.9 30.0 - 36.0 g/dL   RDW 52.8 41.3 - 24.4 %   Platelets 257 150 - 400 K/uL   nRBC 0.0 0.0 - 0.2 %   Neutrophils Relative % 67 %   Neutro Abs 3.5 1.7 - 7.7 K/uL   Lymphocytes Relative 27 %   Lymphs Abs 1.4 0.7 - 4.0 K/uL   Monocytes  Relative 6 %   Monocytes Absolute 0.3 0.1 - 1.0 K/uL   Eosinophils Relative 0 %   Eosinophils Absolute 0.0 0.0 - 0.5 K/uL   Basophils Relative 0 %   Basophils Absolute 0.0 0.0 - 0.1 K/uL   Immature Granulocytes 0 %   Abs Immature Granulocytes 0.00 0.00 - 0.07 K/uL    Comment: Performed at [facility], [address] [place], [place] [address]  Magnesium     Status: None   Collection Time: 04/02/23  3:33 AM  Result Value Ref Range   Magnesium 2.3 1.7 - 2.4 mg/dL    Comment: Performed at [facility], [address], [place], [place] [address]  Urine rapid drug screen (hosp performed)     Status: None   Collection Time: 04/02/23  5:00 AM  Result Value Ref Range   Opiates NONE DETECTED NONE DETECTED   Cocaine NONE DETECTED NONE DETECTED   Benzodiazepines NONE DETECTED NONE DETECTED   Amphetamines NONE DETECTED  NONE DETECTED   Tetrahydrocannabinol NONE DETECTED NONE DETECTED   Barbiturates NONE DETECTED NONE DETECTED    Comment: (NOTE) DRUG SCREEN FOR MEDICAL PURPOSES ONLY.  IF CONFIRMATION IS NEEDED FOR ANY PURPOSE, NOTIFY LAB WITHIN 5 DAYS.  LOWEST DETECTABLE LIMITS FOR URINE DRUG SCREEN Drug Class                     Cutoff (ng/mL) Amphetamine and metabolites    1000 Barbiturate and metabolites    200 Benzodiazepine                 200 Opiates and metabolites        300 Cocaine and metabolites        300 THC                            50 Performed at [facility], [address], [place], [place] [address]   Acetaminophen level     Status: Abnormal   Collection Time: 04/02/23  6:13 AM  Result Value Ref Range   Acetaminophen (Tylenol), Serum <10 (L) 10 - 30 ug/mL    Comment: (NOTE) Therapeutic concentrations vary significantly. A range of 10-30 ug/mL  may be an effective concentration for many patients. However, some  are best treated at concentrations outside of this range. Acetaminophen concentrations >150 ug/mL at 4 hours after ingestion  and >50 ug/mL at 12 hours after ingestion are often associated with  toxic reactions.  Performed at [facility], [address] [place], [place] [address]   CBG monitoring, ED     Status: None   Collection Time: 04/02/23  8:47 AM  Result Value Ref Range   Glucose-Capillary 97 70 - 99 mg/dL    Comment: Glucose reference range applies only to samples taken after fasting for at least 8 hours.  Protime-INR     Status: None   Collection Time: 04/02/23  9:54 AM  Result Value Ref Range   Prothrombin Time 14.0 11.4 - 15.2 seconds   INR 1.1 0.8 - 1.2    Comment: (NOTE) INR goal varies based on device and disease states. Performed at [facility], [address] [place], [place] [address]     No current facility-administered medications for this encounter.   Current Outpatient Medications  Medication Sig Dispense  Refill   meclizine (ANTIVERT) 25 MG tablet Take 1 tablet (25 mg total)  by mouth 3 (three) times daily as needed for dizziness or nausea. (Patient not taking: Reported on 11/07/2020) 20 tablet 0   naproxen sodium (ALEVE) 220 MG tablet Take 220 mg by mouth daily as needed. (Patient not taking: Reported on 11/07/2020)     nortriptyline (PAMELOR) 25 MG capsule Take 1 capsule (25 mg total) by mouth at bedtime. (Patient not taking: Reported on 11/07/2020) 30 capsule 3   ondansetron (ZOFRAN ODT) 4 MG disintegrating tablet Take 1 tablet (4 mg total) by mouth every 8 (eight) hours as needed for nausea or vomiting. (Patient not taking: Reported on 11/07/2020) 20 tablet 0   rizatriptan (MAXALT) 10 MG tablet Take 1 tablet earliest onset of headache.  May repeat in 2 hours if needed.  Maximum 2 tablets in 24 hours (Patient not taking: Reported on 11/07/2020) 10 tablet 3   testosterone (ANDROGEL) 50 MG/5GM (1%) GEL Place 5 g onto the skin daily. (Patient not taking: Reported on 11/07/2020) 150 g 1   vitamin B-12 (CYANOCOBALAMIN) 1000 MCG tablet Take 1 tablet (1,000 mcg total) by mouth daily. (Patient not taking: Reported on 11/07/2020) 30 tablet 1   Vitamin D, Ergocalciferol, (DRISDOL) 1.25 MG (50000 UT) CAPS capsule Take 1 capsule (50,000 Units total) by mouth every 7 (seven) days. (Patient not taking: Reported on 11/07/2020) 8 capsule 0    Musculoskeletal: Strength & Muscle Tone: within normal limits Gait & Station: normal Patient leans: N/A   Psychiatric Specialty Exam: Presentation  General Appearance:  Appropriate for Environment  Eye Contact: Absent  Speech: Other (comment) (Very haulted by crying and sobbing, but eventually clear and coherent and articulate)  Speech Volume: Other (comment) (Variable)  Handedness: Right   Mood and Affect  Mood: Depressed  Affect: Tearful; Constricted   Thought Process  Thought Processes: Goal Directed; Linear  Descriptions of Associations:Intact  (Intermittently circumstantial)  Orientation:Full (Time, Place and Person)  Thought Content:Logical; Perseveration  History of Schizophrenia/Schizoaffective disorder:No data recorded Duration of Psychotic Symptoms:No data recorded Hallucinations:Hallucinations: None  Ideas of Reference:None  Suicidal Thoughts:Suicidal Thoughts: No SI Active Intent and/or Plan: Without Intent; Without Plan; With Means to Carry Out; With Access to Means  Homicidal Thoughts:Homicidal Thoughts: No   Sensorium  Memory: Immediate Good; Recent Good; Remote Good  Judgment: Poor  Insight: Lacking   Executive Functions  Concentration: Fair  Attention Span: Fair  Recall: [employer] of Knowledge: Fair  Language: Fair   Psychomotor Activity  Psychomotor Activity: Psychomotor Activity: Normal   Assets  Assets: Social Support; Manufacturing systems engineer; Talents/Skills; Investment banker, corporate; Health and safety inspector; Housing; Intimacy; Leisure Time; Physical Health; Resilience; Desire for Improvement    Sleep  Sleep: Sleep: Poor   Physical Exam: Physical Exam Vitals and nursing note reviewed.  Constitutional:      General: He is not in acute distress.    Appearance: He is obese. He is not ill-appearing, toxic-appearing or diaphoretic.  Pulmonary:     Effort: Pulmonary effort is normal.  Neurological:     Mental Status: He is alert and oriented to person, place, and time.  Psychiatric:        Attention and Perception: Attention and perception normal. He is attentive. He does not perceive auditory or visual hallucinations.        Mood and Affect: Mood is anxious and depressed. Affect is tearful.        Behavior: Behavior is agitated.        Thought Content: Thought content is not paranoid or delusional. Thought content does not  include homicidal or suicidal ideation.        Cognition and Memory: Cognition and memory normal.    Review of Systems   Psychiatric/Behavioral:  Positive for depression. Negative for hallucinations and substance abuse. The patient is nervous/anxious and has insomnia.   All other systems reviewed and are negative.  Blood pressure (!) 153/97, pulse 76, temperature 98.1 F (36.7 C), temperature source Oral, resp. rate 16, height 5\' 9"  (1.753 m), weight 108.9 kg, SpO2 97 %. Body mass index is 35.44 kg/m.  Medical Decision Making:  Diagnostically, the patient presents with symptomology that is most consistent with the patient's historical and current diagnosis of unipolar major depression.  Given the severe nature and lethality of the patient's suicide attempt, as well as the patient's current severely depressive condition, and risk factors assessed, the patient meets involuntary commitment criteria as well as is recommended for inpatient hospitalization for safety and stability of the patient.  Patient has endorsed he does not desire pharmacological intervention, so will defer this to primary inpatient team upon admission.  Psychiatry will continue to follow the patient until disposition is obtained.   #Suicide attempt (HCC) -Recommend inpatient -Recommend pharmacological intervention for depressive symptomology, will defer to primary inpatient team however at this time -Continue safety precautions  #MDD (major depressive disorder), recurrent severe, without psychosis (HCC) -Recommend inpatient -Recommend pharmacological intervention for depressive symptomology, will defer to primary inpatient team however at this time -Continue safety precautions  Disposition: Recommend psychiatric Inpatient admission when medically cleared.  Lenox Ponds, NP 04/02/2023 11:26 AM

## 2023-04-02 NOTE — Progress Notes (Signed)
Pt was accepted to [facility] TODAY 04/02/2023. Bed assignment: 404-2  Pt meets inpatient criteria per Arsenio Loader, NP  Attending Physician will be Phineas Inches, MD  Report can be called to: - Adult unit: [phone]  Pt can arrive after 9 PM  Care Team Notified: [facility] Lydia, RN, Arsenio Loader, NP, Elayne Snare, DO, Sheryn Bison, RN, and [address], LCSWA  [place], [place]  04/02/2023 3:01 PM

## 2023-04-02 NOTE — ED Notes (Signed)
Report given to [facility] charge [email or web address]  Prentiss

## 2023-04-02 NOTE — ED Triage Notes (Signed)
Pt BIB [facility] from [facility], where pt was dropped off by [employer]. CC: ingested power bleach (less than shot glass amount), 3 tablespoons of roach killer, 24 budweiser, and OTC sleep aid.

## 2023-04-02 NOTE — ED Provider Notes (Signed)
Behavioral Health Urgent Care Medical Screening Exam  Patient Name: Roy Jackson MRN: [id number] Date of Evaluation: 04/02/23 Chief Complaint:   Diagnosis:  Final diagnoses:  None    History of Present illness: Roy Jackson is a 52 y.o. male with no formal documented psychiatric history.  Patient was brought in voluntarily to [facility] due to suicidal attempt.  Patient was evaluated face-to-face and his chart was reviewed by this nurse practitioner.  On assessment, patient is sitting quietly in the assessment room with his head down.  He does not make eye contact and initially will not answer questions. After several redirections, patient confirmed that he drank "a mouth full of bleach, unknown amount of roach killer, 8 tablets of [employer] sleeping pills (he is unsure of  name of the medication), and 24 cans of beers in a suicidal attempt. He also admit to having a loaded gun with intent to kill himself. Patient is unwilling to participate further in assessment.   Patient is alert and oriented, he spoke in a slow rate, and decrease volume. His mood is dysphoric, affect is congruent with mood. Objectively he did not appear to be responding to any internal/external stimuli. No acute distress noted at time of assessment.   This provider informed patient that due to overdose prior to this assessment he will be sent to [facility] for medical clearance and stabilization.     Please consult TTS and psych after medical clearance for follow-up and disposition.   Flowsheet Row ED from 04/02/2023 in [facility] ED from 11/05/2022 in [facility] Emergency Department at [facility] ED from 08/31/2022 in [facility] Emergency Department at [facility]  C-SSRS RISK CATEGORY High Risk No Risk No Risk       Psychiatric Specialty Exam  Presentation  General Appearance:Appropriate for Environment  Eye Contact:None  Speech:Slow  Speech  Volume:Decreased  Handedness:-- (patient refused to answer, unable to assess)   Mood and Affect  Mood: Dysphoric  Affect: Congruent   Thought Process  Thought Processes: -- (patient refused to participate, unable to assess)  Descriptions of Associations:-- (patient refused to participate, unable to assess)  Orientation:Full (Time, Place and Person)  Thought Content:-- (patient refused to participate, unable to assess)    Hallucinations:-- (patient refused to participate, unable to assess)  Ideas of Reference:None  Suicidal Thoughts:Yes, Active -- (patient refused to participate, unable to assess)  Homicidal Thoughts:-- (patient refused to participate, unable to assess)   Sensorium  Memory: -- (patient refused to participate, unable to assess)  Judgment: -- (patient refused to participate, unable to assess)  Insight: -- (patient refused to participate, unable to assess)   Executive Functions  Concentration: -- (patient refused to participate, unable to assess)  Attention Span: -- (patient refused to participate, unable to assess)  Recall: -- (patient refused to participate, unable to assess)  Fund of Knowledge: -- (patient refused to participate, unable to assess)  Language: -- (patient refused to participate, unable to assess)   Psychomotor Activity  Psychomotor Activity: -- (patient refused to participate, unable to assess)   Assets  Assets: -- (patient refused to participate, unable to assess)   Sleep  Sleep: -- (patient refused to participate, unable to assess)  Number of hours: No data recorded  Physical Exam: Physical Exam Vitals reviewed.  Constitutional:      Appearance: He is obese.  HENT:     Head: Normocephalic and atraumatic.     Nose: Nose normal.  Cardiovascular:  Rate and Rhythm: Normal rate.  Pulmonary:     Effort: Pulmonary effort is normal. No respiratory distress.  Musculoskeletal:        General: Normal  range of motion.  Neurological:     Mental Status: He is alert and oriented to person, place, and time.  Psychiatric:        Attention and Perception: Inattentive: unable to assess; pt will not answer. Auditory hallucinations: unable to assess; pt will not answer. Visual hallucinations: unable to assess; pt will not answer.        Mood and Affect: Mood is depressed.        Speech: Noncommunicative: slow pace, decresed volume.        Behavior: Behavior is withdrawn.        Thought Content: Thought content includes suicidal ideation. Thought content includes suicidal plan.    ROS Blood pressure (!) 158/100, pulse 69, temperature 98.7 F (37.1 C), resp. rate 20, SpO2 99 %. There is no height or weight on file to calculate BMI.  Musculoskeletal: Strength & Muscle Tone: within normal limits Gait & Station: normal Patient leans: patient refused to participate, unable to assess    [facility] MSE Discharge Disposition for Follow up and Recommendations: Based on my evaluation the patient appears to have an emergency medical condition for which I recommend the patient be transferred to the emergency department for further evaluation.    This provider informed patient that due to overdose prior to this assessment he will be sent to [facility] for medical clearance and stabilization.     Please consult TTS and psych after medical clearance for follow-up and disposition.  Maricela Bo, NP 04/02/2023, 2:29 AM

## 2023-04-02 NOTE — Progress Notes (Signed)
   04/02/23 0142  [facility] Triage Screening (Walk-ins at [facility] only)  How Did You Hear About [place]? Legal System  What Is the Reason for Your Visit/Call Today? Pt presents to [facility] under IVC, accompanied by [employer] with complaint of "having too much to drink". Pt was not very forthcoming with information and guarded during triage process. He stated " I just need to be here". Pt appears flat and depressed. Per IVC, " The respondent's wife left him and his dog died recently. He texted his best friend and the friend's wife, Lorenso Courier, that he would no longer see them anymore and he was sorry. Respondent did not answer the phone when the friend tried calling back. Therefore, the police was called for a wellness check. The police found him in his bed with a loaded gun, 7 cans of beer, an empty bottle of wine, a bottle of alcohol and sleeping pills on the bed. Respondent told the officers that he drank a cup of bleach and roach killer as well as had OTC medications". Pt currently denies SI,HI,AVH and substance/alcohol use.  How Long Has This Been Causing You Problems? <Week  Have You Recently Had Any Thoughts About Hurting Yourself? Yes  How long ago did you have thoughts about hurting yourself? Pt currently denies, but had thoughts earlier tonight (per IVC)  Are You Planning to Commit Suicide/Harm Yourself At This time? Yes  Have you Recently Had Thoughts About Hurting Someone Karolee Ohs? No  Are You Planning To Harm Someone At This Time? No  Are you currently experiencing any auditory, visual or other hallucinations? No  Have You Used Any Alcohol or Drugs in the Past 24 Hours? Yes  How long ago did you use Drugs or Alcohol? earlier tonight  What Did You Use and How Much? 2-12pks of beer  Do you have any current medical co-morbidities that require immediate attention? No  Clinician description of patient physical appearance/behavior: pt is very flat, appears depressed and soft spoken  What Do You Feel Would Help You the  Most Today? Treatment for Depression or other mood problem  If access to [facility] Urgent Care was not available, would you have sought care in the Emergency Department? Yes  Determination of Need Emergent (2 hours)  Options For Referral Other: Comment;BH Urgent Care;Outpatient Therapy;Inpatient Hospitalization

## 2023-04-02 NOTE — ED Provider Notes (Signed)
Patient signed out to me at 07 100 by Dr. Madilyn Hook pending observation and repeat EKG for medical clearance.  In short this is a 52 year old male that presented to the emergency department after an intentional overdose with ingesting beer and bleach as well as a powdered roach killer.  Patient was placed under IVC prior to my arrival and placed on one-to-one observation.  The patient's workup showed mild hypokalemia that was repleted and otherwise within normal range.  Initial EKG showed normal sinus rhythm without significant abnormality.  Was recommended to have repeat EKG at 0900 and reassessment prior to medical clearance for psychiatry evaluation.  Clinical Course as of 04/02/23 1458  Thu Apr 02, 2023  0949 Repeat EKG within normal range. INR pending. Patient has flat affect, not complaining of any pain or nausea. Heart RRR and lungs CTAB, no wheezing or stridor, no abnormalities in the oropharynx. [VK]  1011 PT/INR normal. He is medically cleared for psych eval. [VK]  1241 Patient evaluated by Arsenio Loader NP with psych who recommended inpatient. [VK]  1458 Patient accepted to [facility] by Dr. Elmer Bales [VK]    Clinical Course User Index [VK] Rexford Maus, DO      Rexford Maus, [place] 04/02/23 1012

## 2023-04-02 NOTE — ED Provider Notes (Signed)
[place] EMERGENCY DEPARTMENT AT [facility] Provider Note   CSN: [id number] Arrival date & time: 04/02/23  0302     History  Chief Complaint  Patient presents with   Ingestion    LENARD KAMPF is a 52 y.o. male.  The history is provided by the patient and medical records (Healthcare provider).  Ingestion  LIONEL WOODBERRY is a 52 y.o. male who presents to the Emergency Department complaining of overdose.  He presents to the emergency department via EMS upon referral from behavioral health urgent care for overdose.  He was initially brought by [employer] to behavioral health due to overdose with suicidal intent.  He was referred to the emergency department for medical clearance.  He states that he drank numerous [employer]'s today.  He also states that around 730 he took over-the-counter sleeping aid from [employer], about 6-8 of those as well as drinking a small amount of household bleach and taking 2 to 3 tablespoons of powdered roach killer from [employer].  He denies medical problems.  He denies any current symptoms.  He refuses to discuss why he took those things.  When asked how he arrived to the emergency department he states that he was not responding to a friend's text.  Per IVC patient's wife left him and his dog died recently.  He texted his best friend and his wife's friend that he would no longer see them anymore and that he was sorry.  He did not answer the phone when the friend tried to call back.  Police were called for a welfare check.  Police found him in his bed with a loaded gun, 7 cans of beer, an empty bottle of wine, a bottle of alcohol and sleeping pills in the bed.    Home Medications Prior to Admission medications   Medication Sig Start Date End Date Taking? Authorizing Provider  meclizine (ANTIVERT) 25 MG tablet Take 1 tablet (25 mg total) by mouth 3 (three) times daily as needed for dizziness or nausea. Patient not taking: Reported on 11/07/2020 03/26/20    Irean Hong, MD  naproxen sodium (ALEVE) 220 MG tablet Take 220 mg by mouth daily as needed. Patient not taking: Reported on 11/07/2020    [provider]  nortriptyline (PAMELOR) 25 MG capsule Take 1 capsule (25 mg total) by mouth at bedtime. Patient not taking: Reported on 11/07/2020 07/07/19   Drema Dallas, DO  ondansetron (ZOFRAN ODT) 4 MG disintegrating tablet Take 1 tablet (4 mg total) by mouth every 8 (eight) hours as needed for nausea or vomiting. Patient not taking: Reported on 11/07/2020 03/26/20   Irean Hong, MD  rizatriptan (MAXALT) 10 MG tablet Take 1 tablet earliest onset of headache.  May repeat in 2 hours if needed.  Maximum 2 tablets in 24 hours Patient not taking: Reported on 11/07/2020 07/07/19   Drema Dallas, DO  testosterone (ANDROGEL) 50 MG/5GM (1%) GEL Place 5 g onto the skin daily. Patient not taking: Reported on 11/07/2020 04/12/19   Avanell Shackleton, NP-C  vitamin B-12 (CYANOCOBALAMIN) 1000 MCG tablet Take 1 tablet (1,000 mcg total) by mouth daily. Patient not taking: Reported on 11/07/2020 03/29/19   Avanell Shackleton, NP-C  Vitamin D, Ergocalciferol, (DRISDOL) 1.25 MG (50000 UT) CAPS capsule Take 1 capsule (50,000 Units total) by mouth every 7 (seven) days. Patient not taking: Reported on 11/07/2020 03/29/19   Avanell Shackleton, NP-C      Allergies  Darvocet [propoxyphene n-acetaminophen], Penicillins, and Sulfa antibiotics    Review of Systems   Review of Systems  All other systems reviewed and are negative.   Physical Exam Updated Vital Signs BP 123/71   Pulse 63   Temp 98.3 F (36.8 C) (Oral)   Resp 16   Ht 5\' 9"  (1.753 m)   Wt 108.9 kg   SpO2 96%   BMI 35.44 kg/m  Physical Exam Vitals and nursing note reviewed.  Constitutional:      Appearance: He is well-developed.  HENT:     Head: Normocephalic and atraumatic.  Cardiovascular:     Rate and Rhythm: Normal rate and regular rhythm.     Heart sounds: No murmur heard. Pulmonary:     Effort:  Pulmonary effort is normal. No respiratory distress.     Breath sounds: Normal breath sounds.  Abdominal:     Palpations: Abdomen is soft.     Tenderness: There is no abdominal tenderness. There is no guarding or rebound.  Musculoskeletal:        General: No tenderness.  Skin:    General: Skin is warm and dry.  Neurological:     Mental Status: He is alert and oriented to person, place, and time.  Psychiatric:     Comments: Refuses to make eye contact.  Flat affect.  Does not appear to be responding to internal stimuli.     ED Results / Procedures / Treatments   Labs (all labs ordered are listed, but only abnormal results are displayed) Labs Reviewed  ACETAMINOPHEN LEVEL - Abnormal; Notable for the following components:      Result Value   Acetaminophen (Tylenol), Serum <10 (*)    All other components within normal limits  SALICYLATE LEVEL - Abnormal; Notable for the following components:   Salicylate Lvl <7.0 (*)    All other components within normal limits  COMPREHENSIVE METABOLIC PANEL - Abnormal; Notable for the following components:   Potassium 3.1 (*)    BUN <5 (*)    All other components within normal limits  ETHANOL  CBC WITH DIFFERENTIAL/PLATELET  MAGNESIUM  RAPID URINE DRUG SCREEN, HOSP PERFORMED  ACETAMINOPHEN LEVEL    EKG EKG Interpretation  Date/Time:  Thursday April 02 2023 03:26:30 EDT Ventricular Rate:  61 PR Interval:  127 QRS Duration: 97 QT Interval:  455 QTC Calculation: 459 R Axis:   85 Text Interpretation: Sinus rhythm Borderline ST elevation, lateral leads Confirmed by Tilden Fossa [id number]) on 04/02/2023 3:36:16 AM  Radiology No results found.  Procedures Procedures    Medications Ordered in ED Medications  potassium chloride SA (KLOR-CON M) CR tablet 40 mEq (40 mEq Oral Given 04/02/23 0456)  CRITICAL CARE Performed by: Tilden Fossa   Total critical care time: 35 minutes  Critical care time was exclusive of separately billable  procedures and treating other patients.  Critical care was necessary to treat or prevent imminent or life-threatening deterioration.  Critical care was time spent personally by me on the following activities: development of treatment plan with patient and/or surrogate as well as nursing, discussions with consultants, evaluation of patient's response to treatment, examination of patient, obtaining history from patient or surrogate, ordering and performing treatments and interventions, ordering and review of laboratory studies, ordering and review of radiographic studies, pulse oximetry and re-evaluation of patient's condition.   ED Course/ Medical Decision Making/ A&P  Medical Decision Making Amount and/or Complexity of Data Reviewed Labs: ordered.  Risk Prescription drug management.   Patient brought in under IVC after overdose by potentially lethal agents.  First exam completed for patient's safety.  In terms of his bleach ingestion, he appears to be relatively asymptomatic.  There is no evidence of intraoral injury and he is swallowing without difficulty and has no pain.  Nursing discussed with poison control.  Initial set of labs unremarkable aside from mild hypokalemia-this was replaced orally..  Plan for repeat EKG at 0900 and reassess for patient's symptoms.  Patient care transferred pending reevaluation.        Final Clinical Impression(s) / ED Diagnoses Final diagnoses:  Suicide attempt [facility])    Rx / DC Orders ED Discharge Orders     None         Tilden Fossa, MD 04/02/23 [id number]

## 2023-04-03 ENCOUNTER — Inpatient Hospital Stay (HOSPITAL_COMMUNITY)
Admission: AD | Admit: 2023-04-03 | Discharge: 2023-04-08 | DRG: 885 | Disposition: A | Discharge status: Home / Self Care | Payer: No Typology Code available for payment source | Source: Intra-hospital | Attending: Psychiatry | Admitting: Psychiatry

## 2023-04-03 ENCOUNTER — Encounter (HOSPITAL_COMMUNITY): Payer: Self-pay

## 2023-04-03 ENCOUNTER — Encounter (HOSPITAL_COMMUNITY): Payer: Self-pay | Admitting: Psychiatry

## 2023-04-03 DIAGNOSIS — Z87891 Personal history of nicotine dependence: Secondary | ICD-10-CM | POA: Diagnosis not present

## 2023-04-03 DIAGNOSIS — Z79899 Other long term (current) drug therapy: Secondary | ICD-10-CM

## 2023-04-03 DIAGNOSIS — K5900 Constipation, unspecified: Secondary | ICD-10-CM | POA: Diagnosis present

## 2023-04-03 DIAGNOSIS — E559 Vitamin D deficiency, unspecified: Secondary | ICD-10-CM | POA: Diagnosis present

## 2023-04-03 DIAGNOSIS — F411 Generalized anxiety disorder: Secondary | ICD-10-CM | POA: Diagnosis present

## 2023-04-03 DIAGNOSIS — G4700 Insomnia, unspecified: Secondary | ICD-10-CM | POA: Diagnosis present

## 2023-04-03 DIAGNOSIS — I10 Essential (primary) hypertension: Secondary | ICD-10-CM | POA: Diagnosis present

## 2023-04-03 DIAGNOSIS — F332 Major depressive disorder, recurrent severe without psychotic features: Principal | ICD-10-CM | POA: Diagnosis present

## 2023-04-03 DIAGNOSIS — Z9151 Personal history of suicidal behavior: Secondary | ICD-10-CM | POA: Diagnosis not present

## 2023-04-03 DIAGNOSIS — K30 Functional dyspepsia: Secondary | ICD-10-CM | POA: Diagnosis present

## 2023-04-03 MED ORDER — LORAZEPAM 2 MG/ML IJ SOLN: 2.0000 mg | Freq: Three times a day (TID) | INTRAMUSCULAR | Status: DC | PRN

## 2023-04-03 MED ORDER — POTASSIUM CHLORIDE CRYS ER 20 MEQ PO TBCR
40.0000 meq | EXTENDED_RELEASE_TABLET | Freq: Two times a day (BID) | ORAL | Status: AC
  Administered 2023-04-03 – 2023-04-04 (×2): 40 meq via ORAL
  Filled 2023-04-03 (×4): qty 2

## 2023-04-03 MED ORDER — DIPHENHYDRAMINE HCL 25 MG PO CAPS: 50.0000 mg | ORAL_CAPSULE | Freq: Three times a day (TID) | ORAL | Status: DC | PRN

## 2023-04-03 MED ORDER — MAGNESIUM HYDROXIDE 400 MG/5ML PO SUSP: 30.0000 mL | Freq: Every day | ORAL | Status: DC | PRN

## 2023-04-03 MED ORDER — HALOPERIDOL LACTATE 5 MG/ML IJ SOLN: 5.0000 mg | Freq: Three times a day (TID) | INTRAMUSCULAR | Status: DC | PRN

## 2023-04-03 MED ORDER — NICOTINE 14 MG/24HR TD PT24
14.0000 mg | MEDICATED_PATCH | Freq: Every day | TRANSDERMAL | Status: DC
  Filled 2023-04-03 (×6): qty 1

## 2023-04-03 MED ORDER — LORAZEPAM 1 MG PO TABS: 2.0000 mg | ORAL_TABLET | Freq: Three times a day (TID) | ORAL | Status: DC | PRN

## 2023-04-03 MED ORDER — TRAZODONE HCL 50 MG PO TABS: 50.0000 mg | ORAL_TABLET | Freq: Every evening | ORAL | Status: DC | PRN

## 2023-04-03 MED ORDER — DIPHENHYDRAMINE HCL 50 MG/ML IJ SOLN: 50.0000 mg | Freq: Three times a day (TID) | INTRAMUSCULAR | Status: DC | PRN

## 2023-04-03 MED ORDER — ALUM & MAG HYDROXIDE-SIMETH 200-200-20 MG/5ML PO SUSP: 30.0000 mL | ORAL | Status: DC | PRN

## 2023-04-03 MED ORDER — HALOPERIDOL 5 MG PO TABS: 5.0000 mg | ORAL_TABLET | Freq: Three times a day (TID) | ORAL | Status: DC | PRN

## 2023-04-03 NOTE — Tx Team (Signed)
Initial Treatment Plan 04/03/2023 2:37 AM [name] ZOX:[id number]    PATIENT STRESSORS: Loss of pet dog per patient's note on chart   Marital or family conflict     PATIENT STRENGTHS: Capable of independent living  General fund of knowledge  Supportive family/friends    PATIENT IDENTIFIED PROBLEMS: Suicidal  Depression  Anxiety      "Going outside and let me deal with stuff the way I have  Been dealing with it for the last 30 years."         DISCHARGE CRITERIA:  Improved stabilization in mood, thinking, and/or behavior Reduction of life-threatening or endangering symptoms to within safe limits  PRELIMINARY DISCHARGE PLAN: Outpatient therapy Participate in family therapy  PATIENT/FAMILY INVOLVEMENT: This treatment plan has been presented to and reviewed with the patient, [name].  The patient and family have been given the opportunity to ask questions and make suggestions.  Marcie Bal, RN 04/03/2023, 2:37 AM

## 2023-04-03 NOTE — [facility] Group Notes (Signed)
[facility] Group Notes:  (Nursing/MHT/Case Management/Adjunct)  Date:  04/03/2023  Time:  8:26 PM  Type of Therapy:   AA meeting  Participation Level:  None  Participation Quality:  Drowsy  Affect:  Flat  Cognitive:  Lacking  Insight:  Lacking  Engagement in Group:  Lacking  Modes of Intervention:  Education  Summary of Progress/Problems: Pt slept during group.  Noah Delaine 04/03/2023, 8:26 PM

## 2023-04-03 NOTE — Progress Notes (Signed)
   04/03/23 2200  Psych Admission Type (Psych Patients Only)  Admission Status Involuntary  Psychosocial Assessment  Patient Complaints Depression  Eye Contact Avoids  Facial Expression Flat;Worried  Affect Flat;Depressed  Speech Soft  Interaction Cautious  Motor Activity Slow  Appearance/Hygiene Unremarkable  Behavior Characteristics Cooperative;Appropriate to situation  Mood Depressed;Sad  Thought Process  Coherency WDL  Content Paranoia  Delusions None reported or observed  Perception WDL  Hallucination None reported or observed  Judgment Poor  Confusion None  Danger to Self  Current suicidal ideation? Denies  Agreement Not to Harm Self Yes  Description of Agreement verbal  Danger to Others  Danger to Others None reported or observed

## 2023-04-03 NOTE — [facility] Group Notes (Signed)
The focus of this group is to help patients establish daily goals to achieve during treatment and discuss how the patient can incorporate goal setting into their daily lives to aide in recovery.      Scale 1-10 7 out of 10      Goal:  to try and get discharged

## 2023-04-03 NOTE — Group Note (Signed)
Recreation Therapy Group Note   Group Topic:Team Building  Group Date: 04/03/2023 Start Time: 0940 End Time: 1012 Facilitators: Takima Encina-McCall, LRT,CTRS Location: [address] Dayroom   Goal Area(s) Addresses:  Patient will effectively work with peer towards shared goal.  Patient will identify skills used to make activity successful.  Patient will identify how skills used during activity can be applied to reach post d/c goals.    Group Description: [employer]. In teams of 5-6, patients were given 11 craft pipe cleaners. Using the materials provided, patients were instructed to compete again the opposing team(s) to build the tallest free-standing structure from floor level. The activity was timed; difficulty increased by Clinical research associate as Production designer, theatre/television/film continued.  Systematically resources were removed with additional directions for example, placing one arm behind their back, working in silence, and shape stipulations. LRT facilitated post-activity discussion reviewing team processes and necessary communication skills involved in completion. Patients were encouraged to reflect how the skills utilized, or not utilized, in this activity can be incorporated to positively impact support systems post discharge.   Affect/Mood: Flat   Participation Level: None   Participation Quality: None   Behavior: Withdrawn   Speech/Thought Process: N/A   Insight: N/A   Judgement: N/A   Modes of Intervention: STEM Activity   Patient Response to Interventions:  N/A   Education Outcome:  In group clarification offered    Clinical Observations/Individualized Feedback: Pt did not participate. Pt was sitting by himself playing solitaire with a deck of cards.    Plan: Continue to engage patient in RT group sessions 2-3x/week.   Ilija Maxim-McCall, LRT,CTRS  04/03/2023 11:50 AM
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
## 2023-04-03 NOTE — Plan of Care (Signed)
  Problem: Education: Goal: Emotional status will improve Outcome: Progressing   Problem: Activity: Goal: Interest or engagement in activities will improve Outcome: Progressing   

## 2023-04-03 NOTE — Plan of Care (Signed)
  Problem: Education: Goal: Emotional status will improve Outcome: Not Progressing Goal: Mental status will improve Outcome: Not Progressing   

## 2023-04-03 NOTE — BH IP Treatment Plan (Signed)
Interdisciplinary Treatment and Diagnostic Plan Update  04/03/2023 Time of Session: 1145 Roy Jackson MRN: [id number]  Principal Diagnosis: MDD (major depressive disorder), recurrent episode, severe (HCC)  Secondary Diagnoses: Principal Problem:   MDD (major depressive disorder), recurrent episode, severe (HCC)   Current Medications:  Current Facility-Administered Medications  Medication Dose Route Frequency Provider Last Rate Last Admin   alum & mag hydroxide-simeth (MAALOX/MYLANTA) 200-200-20 MG/5ML suspension 30 mL  30 mL Oral Q4H PRN Lenox Ponds, NP       diphenhydrAMINE (BENADRYL) capsule 50 mg  50 mg Oral TID PRN Lenox Ponds, NP       Or   diphenhydrAMINE (BENADRYL) injection 50 mg  50 mg Intramuscular TID PRN Lenox Ponds, NP       haloperidol (HALDOL) tablet 5 mg  5 mg Oral TID PRN Lenox Ponds, NP       Or   haloperidol lactate (HALDOL) injection 5 mg  5 mg Intramuscular TID PRN Lenox Ponds, NP       LORazepam (ATIVAN) tablet 2 mg  2 mg Oral TID PRN Lenox Ponds, NP       Or   LORazepam (ATIVAN) injection 2 mg  2 mg Intramuscular TID PRN Lenox Ponds, NP       magnesium hydroxide (MILK OF MAGNESIA) suspension 30 mL  30 mL Oral Daily PRN Lenox Ponds, NP       nicotine (NICODERM CQ - dosed in mg/24 hours) patch 14 mg  14 mg Transdermal Q0600 Lenox Ponds, NP       traZODone (DESYREL) tablet 50 mg  50 mg Oral QHS PRN Lenox Ponds, NP       PTA Medications: Medications Prior to Admission  Medication Sig Dispense Refill Last Dose   Multiple Vitamins-Minerals (MULTIVITAMIN GUMMIES ADULT PO) Take 1 tablet by mouth daily.   03/30/2023   OVER THE COUNTER MEDICATION Take 500 mg by mouth daily.   03/30/2023    Patient Stressors: Loss of pet dog per patient's note on chart   Marital or family conflict    Patient Strengths: Capable of independent living  General fund of knowledge  Supportive family/friends   Treatment Modalities:  Medication Management, Group therapy, Case management,  1 to 1 session with clinician, Psychoeducation, Recreational therapy.   Physician Treatment Plan for Primary Diagnosis: MDD (major depressive disorder), recurrent episode, severe (HCC) Long Term Goal(s):     Short Term Goals:    Medication Management: Evaluate patient's response, side effects, and tolerance of medication regimen.  Therapeutic Interventions: 1 to 1 sessions, Unit Group sessions and Medication administration.  Evaluation of Outcomes: Progressing  Physician Treatment Plan for Secondary Diagnosis: Principal Problem:   MDD (major depressive disorder), recurrent episode, severe (HCC)  Long Term Goal(s):     Short Term Goals:       Medication Management: Evaluate patient's response, side effects, and tolerance of medication regimen.  Therapeutic Interventions: 1 to 1 sessions, Unit Group sessions and Medication administration.  Evaluation of Outcomes: Progressing   RN Treatment Plan for Primary Diagnosis: MDD (major depressive disorder), recurrent episode, severe (HCC) Long Term Goal(s): Knowledge of disease and therapeutic regimen to maintain health will improve  Short Term Goals: Ability to remain free from injury will improve, Ability to verbalize frustration and anger appropriately will improve, Ability to demonstrate self-control, Ability to participate in decision making will improve, Ability to verbalize feelings will improve, Ability to disclose and discuss suicidal ideas,  Ability to identify and develop effective coping behaviors will improve, and Compliance with prescribed medications will improve  Medication Management: RN will administer medications as ordered by provider, will assess and evaluate patient's response and provide education to patient for prescribed medication. RN will report any adverse and/or side effects to prescribing provider.  Therapeutic Interventions: 1 on 1 counseling sessions,  Psychoeducation, Medication administration, Evaluate responses to treatment, Monitor vital signs and CBGs as ordered, Perform/monitor CIWA, COWS, AIMS and Fall Risk screenings as ordered, Perform wound care treatments as ordered.  Evaluation of Outcomes: Progressing   LCSW Treatment Plan for Primary Diagnosis: MDD (major depressive disorder), recurrent episode, severe (HCC) Long Term Goal(s): Safe transition to appropriate next level of care at discharge, Engage patient in therapeutic group addressing interpersonal concerns.  Short Term Goals: Engage patient in aftercare planning with referrals and resources, Increase social support, Increase ability to appropriately verbalize feelings, Increase emotional regulation, Facilitate acceptance of mental health diagnosis and concerns, Facilitate patient progression through stages of change regarding substance use diagnoses and concerns, Identify triggers associated with mental health/substance abuse issues, and Increase skills for wellness and recovery  Therapeutic Interventions: Assess for all discharge needs, 1 to 1 time with Social worker, Explore available resources and support systems, Assess for adequacy in community support network, Educate family and significant other(s) on suicide prevention, Complete Psychosocial Assessment, Interpersonal group therapy.  Evaluation of Outcomes: Progressing   Progress in Treatment: Attending groups: No. Participating in groups: No. Taking medication as prescribed: Yes. Toleration medication: No. Family/Significant other contact made: No, will contact:  Josephine Cables [phone] Patient understands diagnosis: No. Discussing patient identified problems/goals with staff: Yes. Medical problems stabilized or resolved: Yes. Denies suicidal/homicidal ideation: Yes. Issues/concerns per patient self-inventory: Yes. Other: N/A  New problem(s) identified: No, Describe:  None reported  New Short Term/Long Term  Goal(s): elimination of SI thoughts, development of comprehensive mental wellness plan.   Patient Goals:  "I want to go home  Discharge Plan or Barriers: :  Patient recently admitted. CSW will continue to follow and assess for appropriate referrals and possible discharge planning.   Reason for Continuation of Hospitalization: Anxiety Depression Suicidal ideation Withdrawal symptoms  Estimated Length of Stay: 3-7 Days  Last 3 [place] Suicide Severity Risk Score: Flowsheet Row Ad[facility] (Current) [facility] Most recent reading at 04/03/2023 12:20 AM ED from 04/02/2023 in [facility] Emergency Department at [facility] Most recent reading at 04/02/2023 10:33 AM ED from 04/02/2023 in [facility] Most recent reading at 04/02/2023  1:53 AM  C-SSRS RISK CATEGORY High Risk High Risk High Risk       Last PHQ 2/9 Scores:    02/10/2019    9:37 AM 11/13/2017    4:16 PM  Depression screen PHQ 2/9  Decreased Interest 0 0  Down, Depressed, Hopeless 3 0  PHQ - 2 Score 3 0  Altered sleeping 3   Tired, decreased energy 3   Change in appetite 1   Feeling bad or failure about yourself  3   Trouble concentrating 3   Moving slowly or fidgety/restless 3   Suicidal thoughts 0   PHQ-9 Score 19   Difficult doing work/chores Somewhat difficult    detox, medication management for mood stabilization; elimination of SI thoughts; development of comprehensive mental wellness/sobriety plan    Scribe for Treatment Team: Ane Payment, LCSW 04/03/2023 1:31 PM

## 2023-04-03 NOTE — Progress Notes (Signed)
   04/03/23 0900  Psych Admission Type (Psych Patients Only)  Admission Status Involuntary  Psychosocial Assessment  Patient Complaints Anxiety;Depression  Eye Contact Avoids  Facial Expression Flat;Anxious;Sad  Affect Anxious;Depressed;Flat  Speech Soft  Interaction Cautious;Isolative  Motor Activity Slow  Appearance/Hygiene Unremarkable  Behavior Characteristics Cooperative;Appropriate to situation  Mood Depressed;Anxious;Sad  Thought Process  Coherency WDL  Content Paranoia  Delusions None reported or observed  Perception WDL  Hallucination None reported or observed  Judgment Poor  Confusion None  Danger to Self  Current suicidal ideation? Passive  Self-Injurious Behavior Some self-injurious ideation observed or expressed.  No lethal plan expressed   Agreement Not to Harm Self Yes  Description of Agreement Verbal  Danger to Others  Danger to Others None reported or observed

## 2023-04-03 NOTE — Progress Notes (Signed)
   04/03/23 0528  15 Minute Checks  Location Bedroom  Visual Appearance Calm  Behavior Sleeping  Sleep (Behavioral Health Patients Only)  Calculate sleep? (Click Yes once per 24 hr at 0600 safety check) Yes  Documented sleep last 24 hours 3.5

## 2023-04-03 NOTE — Progress Notes (Signed)
   04/03/23 0100  Psych Admission Type (Psych Patients Only)  Admission Status Involuntary  Psychosocial Assessment  Patient Complaints Anxiety;Depression;Crying spells;Hopelessness;Sadness;Self-harm behaviors;Suspiciousness  Eye Contact Avoids  Facial Expression Flat;Pained  Affect Depressed;Anxious  Speech Soft  Interaction Cautious  Motor Activity Slow  Appearance/Hygiene In scrubs  Behavior Characteristics Cooperative  Mood Depressed;Anxious;Sad  Aggressive Behavior  Targets Self  Type of Behavior Weapon (Patient found with loaded gun)  Effect No apparent injury  Thought Process  Coherency Circumstantial  Content WDL  Delusions None reported or observed  Perception WDL  Hallucination None reported or observed  Judgment Poor  Confusion None  Danger to Self  Current suicidal ideation? Passive  Self-Injurious Behavior Some self-injurious ideation observed or expressed.  No lethal plan expressed   Agreement Not to Harm Self Yes  Description of Agreement verbal  Danger to Others  Danger to Others None reported or observed

## 2023-04-03 NOTE — [facility] Suicide Risk Assessment (Signed)
Suicide Risk Assessment  Admission Assessment    [facility] Admission Suicide Risk Assessment   Nursing information obtained from:  Patient Demographic factors:  Male, Living alone, Access to firearms Current Mental Status:  Suicidal ideation indicated by patient, Suicidal ideation indicated by others, Self-harm behaviors Loss Factors:  NA Historical Factors:  Impulsivity Risk Reduction Factors:  Positive social support  Total Time spent with patient: 1.5 hours Principal Problem: MDD (major depressive disorder), recurrent episode, severe (HCC) Diagnosis:  Principal Problem:   MDD (major depressive disorder), recurrent episode, severe (HCC)  Reason for Admission: Roy Jackson is a 52 yo AAM with a prior history of MDD who presented to the [facility]) under IVC taken out by law enforcement after he was found in bed with a loaded gun, 7 cans of beer, an empty bottle of wine, a bottle of alcohol and sleeping pills. He told law enforcement that he drank a cup of bleach and ate roach killer as per documentation from the [facility]. Pt was found after his friend called law enforcement for a wellness check when he sent messages to friend to state say goodbye ,and that he would not be seeing him anymore.    Continued Clinical Symptoms: His mood, poor insight, poor judgment, is not able to comprehend at this time that events leading to this hospitalization could have resulted in fatality.  Requires continuous hospitalization at this time for treatment and stabilization of mental status.  Alcohol Use Disorder Identification Test Final Score (AUDIT): 1 The "Alcohol Use Disorders Identification Test", Guidelines for Use in Primary Care, Second Edition.  World Science writer [facility]). Score between 0-7:  no or low risk or alcohol related problems. Score between 8-15:  moderate risk of alcohol related problems. Score between 16-19:  high risk of alcohol related  problems. Score 20 or above:  warrants further diagnostic evaluation for alcohol dependence and treatment.  CLINICAL FACTORS:   Depression:   Anhedonia Hopelessness Insomnia Severe  Musculoskeletal: Strength & Muscle Tone: within normal limits Gait & Station: normal Patient leans: N/A  Psychiatric Specialty Exam:  Presentation  General Appearance:  Fairly Groomed  Eye Contact: Fair  Speech: Clear and Coherent  Speech Volume: Normal  Handedness: Right   Mood and Affect  Mood: Depressed  Affect: Congruent   Thought Process  Thought Processes: Coherent  Descriptions of Associations:Intact  Orientation:Full (Time, Place and Person)  Thought Content:Illogical  History of Schizophrenia/Schizoaffective disorder:No data recorded Duration of Psychotic Symptoms:No data recorded Hallucinations:Hallucinations: None  Ideas of Reference:None  Suicidal Thoughts:Suicidal Thoughts: No SI Active Intent and/or Plan: Without Intent; Without Plan; With Means to Carry Out; With Access to Means  Homicidal Thoughts:Homicidal Thoughts: No   Sensorium  Memory: Immediate Good  Judgment: Fair  Insight: Fair   Art therapist  Concentration: Fair  Attention Span: Fair  Recall: [employer] of Knowledge: Fair  Language: Fair   Psychomotor Activity  Psychomotor Activity: Psychomotor Activity: Normal   Assets  Assets: Resilience  Sleep  Sleep: Sleep: Poor  Physical Exam: Physical Exam Review of Systems  Constitutional:  Negative for fever.  HENT:  Negative for hearing loss.   Eyes:  Negative for blurred vision.  Respiratory:  Negative for cough.   Cardiovascular:  Negative for chest pain.  Gastrointestinal:  Negative for heartburn.  Genitourinary:  Negative for dysuria.  Musculoskeletal:  Negative for myalgias.  Skin:  Negative for rash.  Neurological:  Negative for dizziness.  Psychiatric/Behavioral:  Positive for depression  and  substance abuse. Negative for hallucinations, memory loss and suicidal ideas. The patient is nervous/anxious and has insomnia.    Blood pressure (!) 144/85, pulse 72, temperature 98.2 F (36.8 C), temperature source Oral, resp. rate 16, height 5\' 8"  (1.727 m), weight 108.9 kg, SpO2 100 %. Body mass index is 36.49 kg/m.   COGNITIVE FEATURES THAT CONTRIBUTE TO RISK:  None    SUICIDE RISK:   Mild:  There are no identifiable suicide plans, no associated intent, mild dysphoria and related symptoms, good self-control (both objective and subjective assessment), few other risk factors, and identifiable protective factors, including available and accessible social support.   PLAN OF CARE: See H & P  I certify that inpatient services furnished can reasonably be expected to improve the patient's condition.   Starleen Blue, NP 04/03/2023, 6:54 PM

## 2023-04-04 DIAGNOSIS — F332 Major depressive disorder, recurrent severe without psychotic features: Secondary | ICD-10-CM | POA: Diagnosis not present

## 2023-04-04 DIAGNOSIS — F411 Generalized anxiety disorder: Secondary | ICD-10-CM | POA: Diagnosis present

## 2023-04-04 LAB — TSH: TSH: 0.956 u[IU]/mL (ref 0.350–4.500)

## 2023-04-04 LAB — LIPID PANEL
Cholesterol: 223 mg/dL — ABNORMAL HIGH (ref 0–200)
HDL: 43 mg/dL (ref >40)
LDL Cholesterol: 163 mg/dL — ABNORMAL HIGH (ref 0–99)
Total CHOL/HDL Ratio: 5.2 RATIO
Triglycerides: 85 mg/dL (ref <150)
VLDL: 17 mg/dL (ref 0–40)

## 2023-04-04 LAB — BASIC METABOLIC PANEL
Anion gap: 10 (ref 5–15)
BUN: 11 mg/dL (ref 6–20)
CO2: 25 mmol/L (ref 22–32)
Calcium: 9.1 mg/dL (ref 8.9–10.3)
Chloride: 104 mmol/L (ref 98–111)
Creatinine, Ser: 1.03 mg/dL (ref 0.61–1.24)
GFR, Estimated: >60 mL/min (ref >60)
Glucose, Bld: 88 mg/dL (ref 70–99)
Potassium: 3.6 mmol/L (ref 3.5–5.1)
Sodium: 139 mmol/L (ref 135–145)

## 2023-04-04 LAB — VITAMIN D 25 HYDROXY (VIT D DEFICIENCY, FRACTURES): Vit D, 25-Hydroxy: 19.81 ng/mL — ABNORMAL LOW (ref 30–100)

## 2023-04-04 MED ORDER — TRAZODONE HCL 50 MG PO TABS
50.0000 mg | ORAL_TABLET | Freq: Every day | ORAL | Status: DC
  Filled 2023-04-04 (×5): qty 1

## 2023-04-04 MED ORDER — VITAMIN D (ERGOCALCIFEROL) 1.25 MG (50000 UNIT) PO CAPS
50000.0000 [IU] | ORAL_CAPSULE | ORAL | Status: DC
  Administered 2023-04-04: 50000 [IU] via ORAL
  Filled 2023-04-04 (×2): qty 1

## 2023-04-04 MED ORDER — FLUOXETINE HCL 20 MG PO CAPS
20.0000 mg | ORAL_CAPSULE | Freq: Every day | ORAL | Status: DC
  Administered 2023-04-04 – 2023-04-08 (×5): 20 mg via ORAL
  Filled 2023-04-04 (×7): qty 1

## 2023-04-04 MED ORDER — HYDROXYZINE HCL 25 MG PO TABS
25.0000 mg | ORAL_TABLET | Freq: Three times a day (TID) | ORAL | Status: DC | PRN
  Filled 2023-04-04: qty 10

## 2023-04-04 NOTE — Group Note (Signed)
Date:  04/04/2023 Time:  6:47 PM  Group Topic/Focus:  Dimensions of Wellness:   The focus of this group is to introduce the topic of wellness and discuss the role each dimension of wellness plays in total health.    Participation Level:  Active  Participation Quality:  Appropriate  Affect:  Appropriate  Cognitive:  Appropriate  Insight: Appropriate  Engagement in Group:  Engaged  Modes of Intervention:  Discussion and Support  Additional Comments:   Pt attended and participated in the Social Wellness group.  Edmund Hilda Niti Leisure 04/04/2023, 6:47 PM

## 2023-04-04 NOTE — Progress Notes (Signed)
   04/04/23 0631  Vital Signs  Temp 98.3 F (36.8 C)  Temp Source Oral  Pulse Rate 73  Resp 20  BP (!) 171/103  BP Location Left Arm  BP Method Automatic  Patient Position (if appropriate) Sitting     04/04/23 0632  Vital Signs  Pulse Rate 81  Pulse Rate Source Monitor  BP (!) 142/105  BP Location Left Arm  BP Method Automatic  Patient Position (if appropriate) Standing  Oxygen Therapy  SpO2 100 %     Notified Provider:   Informed Provider that the Patient is very anxious and would not accept anything for anxiety. Advised patient that I will recheck but if still high, the doctor may prescribe something. Advised Provider that Patient is still refusing taking any meds.   Per Provider, continue to offer prn anxiety medications since he is so anxious.   Advised Provider that patient says he's never taken medication.  He doesn't want to become dependent and unable to cope without them if he can not afford them.  Per Provider will request BP recheck and advise day nurse to discuss with doctor.

## 2023-04-04 NOTE — Progress Notes (Signed)
  During writer's assessment of patient, patient complains that he was told a lie, stating, "one doctor told me I would here a day, then another a couple of days, and today I was told I could be here a week."  Patient reports he is a calm person now, but 15-20 years ago, he could have torn this place apart.  Patient reports he realizes his part in all of this.  Patient says he has never taken medication, and being here is more harm to him because now he will have to undo his experience here.  Patient reports that taking medication would alter his ability to cope.  Patient expresses concern about being away from his job.  Writer encourages him to get a note from social work.  Patient denies SI/HI/AVH.  Patient is observed in the milieu.  MHT reports patient had an outburst during group.  Patient is safe on the uinit with q 15 minute safety checks.

## 2023-04-04 NOTE — Group Note (Signed)
Date:  04/04/2023 Time:  6:21 PM  Group Topic/Focus:  Goals Group:   The focus of this group is to help patients establish daily goals to achieve during treatment and discuss how the patient can incorporate goal setting into their daily lives to aide in recovery. Orientation:   The focus of this group is to educate the patient on the purpose and policies of crisis stabilization and provide a format to answer questions about their admission.  The group details unit policies and expectations of patients while admitted.    Participation Level:  Active  Participation Quality:  Attentive  Affect:  Appropriate  Cognitive:  Appropriate  Insight: Appropriate  Engagement in Group:  Engaged  Modes of Intervention:  Activity, Orientation, and Rapport Building  Additional Comments:   Pt attended and participated in the Orientation/Goals group. Pt completed the goals activity. [name] goal is to be discharged from treatment.  Roy Jackson 04/04/2023, 6:21 PM

## 2023-04-04 NOTE — Progress Notes (Signed)
Pt is A&OX4, calm, pleasant mood, denies suicidal ideations, denies homicidal ideations, denies auditory hallucinations and denies visual hallucinations. Pt verbally agrees to approach staff if these become apparent and before harming self or others. Pt denies experiencing nightmares. Mood and affect are congruent. Pt appetite is ok. No complaints of anxiety, distress, pain and/or discomfort at this time. [name] memory appears to be grossly intact, and Pt hasn't displayed any injurious behaviors. Pt is medication compliant. There's no evidence of suicidal intent. Psychomotor activity was WNL. No s/s of Parkinson, Dystonia, Akathisia and/or Tardive Dyskinesia noted.

## 2023-04-04 NOTE — Progress Notes (Signed)
  Blood Pressure Recheck:    04/04/23 0740  Vital Signs  Pulse Rate 71  Pulse Rate Source Monitor  BP 129/83  BP Location Right Arm  BP Method Automatic  Patient Position (if appropriate) Lying  Oxygen Therapy  SpO2 99 %  O2 Device Room Air
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
## 2023-04-05 DIAGNOSIS — F332 Major depressive disorder, recurrent severe without psychotic features: Secondary | ICD-10-CM

## 2023-04-05 NOTE — [facility] Group Notes (Signed)
[facility] Group Notes:  (Nursing/MHT/Case Management/Adjunct)  Date:  04/05/2023  Time:  12:34 AM  Type of Therapy:   Wrap-up group  Participation Level:  Active  Participation Quality:  Appropriate  Affect:  Appropriate  Cognitive:  Appropriate  Insight:  Appropriate  Engagement in Group:  Engaged  Modes of Intervention:  Education  Summary of Progress/Problems: Pt goal to be D/C. Day 4/10.  Noah Delaine 04/05/2023, 12:34 AM

## 2023-04-05 NOTE — Progress Notes (Signed)
[facility] MD Progress Note  04/05/2023 6:34 PM Roy Jackson  MRN:  [id number] Principal Problem: MDD (major depressive disorder), recurrent severe, without psychosis (HCC) Diagnosis: Principal Problem:   MDD (major depressive disorder), recurrent severe, without psychosis (HCC) Active Problems:   Vitamin D deficiency   Generalized anxiety disorder  Reason for Admission: Roy Jackson is a 52 yo AAM with a prior history of MDD who presented to the [facility]) under IVC taken out by law enforcement after he was found in bed with a loaded gun, 7 cans of beer, an empty bottle of wine, a bottle of alcohol and sleeping pills. He told law enforcement that he drank a cup of bleach and ate roach killer as per documentation from the [facility]. Pt was found after his friend called law enforcement for a wellness check when he sent messages to friend to state say goodbye ,and that he would not be seeing him anymore.  Patient was transferred to this behavioral health Hospital for treatment and stabilization of her mental status.  Yesterday the psychiatry team made the following recommendations:  -Continue Prozac 20 mg daily for depressive symptoms and anxiety -Continue trazodone 50 mg p.o. q. nightly for insomnia.  -Continue hydroxyzine 25 mg 3 times daily as needed for anxiety    On assessment today, the pt reports that his mood is depressed & anxious, patient continues to perseverate about wanting to be discharged, states that he wants to be discharged so as to return to work.  He has been educated on the situation leading to this hospitalization could have been fatal, but he lacks insight, and continues to come up with illogical explanations for consuming alcohol, roach poison, drinking bleach to clean his system inside and out, and drinking 12 packs of alcohol prior to this hospitalization. Reports that anxiety is reduced and rates as #4/10, with 10 being high  severity Nursing staff report patient sleeping 8 hours last night Appetite is good Concentration is improved Energy level is adequate Denies suicidal thoughts.  Denies suicidal intent or plan.  Denies having any HI.  Denies having psychotic symptoms.   Denies having side effects to current psychiatric medications.   We discussed clients compliance to current medication regimen.  Discussed the following psychosocial stressors: Attending and participating in therapeutic milieu and group activities.  Attending to ADLs and taking showers for adequate hygiene status.  Projected discharge date for patient is Wednesday, July 3rd, 2024, pending safety planning being completed by LCSW and outpatient follow-up appointments being made.  We will continue medications as listed below.  Patient has been educated on the rationales, benefits, and possible side effects of all medications.   Total Time spent with patient: 45 minutes  Past Psychiatric History: Previous Psych Diagnoses: MDD, intermittent explosive disorder as a child. Prior inpatient treatment: Denies Current/prior outpatient treatment: Denies Prior rehab hx: Denies Psychotherapy hx: Denies History of suicide attempt: Reports prior suicide attempts when he was 52 years old, states he tried to hang himself after he found out that his grandparents with whom he was residing were not his true parents.  He states that the person that he had been referring to as his aunt was his biological mother.  He denies any other attempts in the past. History of homicide or aggression: Denies Psychiatric medication history: Unable to recall medications, but states medications were tried as a child, but he has never been compliant with medications. Psychiatric medication compliance history: Unable to recall  Neuromodulation history: Denies Current Psychiatrist: None Current therapist: None  Past Medical History:  Past Medical History:  Diagnosis Date    Chronic headaches    History of hypogonadism 03/28/2019   OSA (obstructive sleep apnea) 11/12/2017   Per sleep study 10/2017    Past Surgical History:  Procedure Laterality Date   HERNIA REPAIR     TONSILLECTOMY     Family History:  Family History  Problem Relation Age of Onset   Heart attack Other        maternal GF died in 15s, maternal uncles x 2 in 65s    Hypertension Paternal Grandmother    Hypertension Paternal Grandfather    Diabetes Paternal Grandfather    Family Psychiatric  History: See H & P Social History:  Social History   Substance and Sexual Activity  Alcohol Use Not Currently   Comment: occ     Social History   Substance and Sexual Activity  Drug Use No    Social History   Socioeconomic History   Marital status: Married    Spouse name: Not on file   Number of children: 2   Years of education: 12   Highest education level: Not on file  Occupational History   Not on file  Tobacco Use   Smoking status: Former    Types: Cigarettes   Smokeless tobacco: Never   Tobacco comments:    last one 2-3 months ago  Vaping Use   Vaping Use: Never used  Substance and Sexual Activity   Alcohol use: Not Currently    Comment: occ   Drug use: No   Sexual activity: Not Currently    Partners: Female  Other Topics Concern   Not on file  Social History Narrative   Smoking 1/2 pk per week    leave wife   2-story home   Soda 1-2 a day   11th grade   Social Determinants of Health   Financial Resource Strain: Not on file  Food Insecurity: Patient Declined (04/03/2023)   Hunger Vital Sign    Worried About Running Out of Food in the Last Year: Patient declined    Ran Out of Food in the Last Year: Patient declined  Transportation Needs: No Transportation Needs (04/03/2023)   PRAPARE - Administrator, Civil Service (Medical): No    Lack of Transportation (Non-Medical): No  Physical Activity: Not on file  Stress: Not on file  Social Connections: Not  on file   Sleep: Good  Appetite:  Good  Current Medications: Current Facility-Administered Medications  Medication Dose Route Frequency Provider Last Rate Last Admin   alum & mag hydroxide-simeth (MAALOX/MYLANTA) 200-200-20 MG/5ML suspension 30 mL  30 mL Oral Q4H PRN Lenox Ponds, NP       diphenhydrAMINE (BENADRYL) capsule 50 mg  50 mg Oral TID PRN Lenox Ponds, NP       Or   diphenhydrAMINE (BENADRYL) injection 50 mg  50 mg Intramuscular TID PRN Lenox Ponds, NP       FLUoxetine (PROZAC) capsule 20 mg  20 mg Oral Daily Starleen Blue, NP   20 mg at 04/05/23 0816   haloperidol (HALDOL) tablet 5 mg  5 mg Oral TID PRN Lenox Ponds, NP       Or   haloperidol lactate (HALDOL) injection 5 mg  5 mg Intramuscular TID PRN Lenox Ponds, NP       hydrOXYzine (ATARAX) tablet 25 mg  25 mg Oral  TID PRN Starleen Blue, NP       LORazepam (ATIVAN) tablet 2 mg  2 mg Oral TID PRN Lenox Ponds, NP       Or   LORazepam (ATIVAN) injection 2 mg  2 mg Intramuscular TID PRN Lenox Ponds, NP       magnesium hydroxide (MILK OF MAGNESIA) suspension 30 mL  30 mL Oral Daily PRN Lenox Ponds, NP       nicotine (NICODERM CQ - dosed in mg/24 hours) patch 14 mg  14 mg Transdermal Q0600 Lenox Ponds, NP       traZODone (DESYREL) tablet 50 mg  50 mg Oral QHS Starleen Blue, NP       Vitamin D (Ergocalciferol) (DRISDOL) 1.25 MG (50000 UNIT) capsule 50,000 Units  50,000 Units Oral Q7 days Starleen Blue, NP   50,000 Units at 04/04/23 1259    Lab Results:  Results for orders placed or performed during the hospital encounter of 04/03/23 (from the past 48 hour(s))  Basic metabolic panel     Status: None   Collection Time: 04/04/23  6:30 AM  Result Value Ref Range   Sodium 139 135 - 145 mmol/L   Potassium 3.6 3.5 - 5.1 mmol/L   Chloride 104 98 - 111 mmol/L   CO2 25 22 - 32 mmol/L   Glucose, Bld 88 70 - 99 mg/dL    Comment: Glucose reference range applies only to samples taken after  fasting for at least 8 hours.   BUN 11 6 - 20 mg/dL   Creatinine, Ser 4.09 0.61 - 1.24 mg/dL   Calcium 9.1 8.9 - 81.1 mg/dL   GFR, Estimated >91 >47 mL/min    Comment: (NOTE) Calculated using the CKD-EPI Creatinine Equation (2021)    Anion gap 10 5 - 15    Comment: Performed at [facility], [address], [place], [place] [address]  Lipid panel     Status: Abnormal   Collection Time: 04/04/23  6:30 AM  Result Value Ref Range   Cholesterol 223 (H) 0 - 200 mg/dL   Triglycerides 85 <213 mg/dL   HDL 43 >08 mg/dL   Total CHOL/HDL Ratio 5.2 RATIO   VLDL 17 0 - 40 mg/dL   LDL Cholesterol 657 (H) 0 - 99 mg/dL    Comment:        Total Cholesterol/HDL:CHD Risk Coronary Heart Disease Risk Table                     Men   Women  1/2 Average Risk   3.4   3.3  Average Risk       5.0   4.4  2 X Average Risk   9.6   7.1  3 X Average Risk  23.4   11.0        Use the calculated Patient Ratio above and the CHD Risk Table to determine the patient's CHD Risk.        ATP III CLASSIFICATION (LDL):  <100     mg/dL   Optimal  846-962  mg/dL   Near or Above                    Optimal  130-159  mg/dL   Borderline  952-841  mg/dL   High  >324     mg/dL   Very High Performed at [facility], [address], [place], [place] [address]   TSH  Status: None   Collection Time: 04/04/23  6:30 AM  Result Value Ref Range   TSH 0.956 0.350 - 4.500 uIU/mL    Comment: Performed by a 3rd Generation assay with a functional sensitivity of <=0.01 uIU/mL. Performed at [facility], [address] [place], [place] [address]   VITAMIN D 25 Hydroxy (Vit-D Deficiency, Fractures)     Status: Abnormal   Collection Time: 04/04/23  6:30 AM  Result Value Ref Range   Vit D, 25-Hydroxy 19.81 (L) 30 - 100 ng/mL    Comment: (NOTE) Vitamin D deficiency has been defined by the Institute of Medicine  and an Endocrine Society practice guideline as a level of  serum 25-OH  vitamin D less than 20 ng/mL (1,2). The Endocrine Society went on to  further define vitamin D insufficiency as a level between 21 and 29  ng/mL (2).  1. IOM (Institute of Medicine). 2010. Dietary reference intakes for  calcium and D. [place]: [employer]. 2. Holick MF, Binkley [place], Bischoff-Ferrari HA, et al. Evaluation,  treatment, and prevention of vitamin D deficiency: an Endocrine  Society clinical practice guideline, JCEM. 2011 Jul; 96(7): 1911-30.  Performed at [facility], [address] [place], [place] [address]     Blood Alcohol level:  Lab Results  Component Value Date   ETH <10 04/02/2023    Metabolic Disorder Labs: Lab Results  Component Value Date   HGBA1C 5.5 06/09/2014   MPG 111 06/09/2014   Lab Results  Component Value Date   PROLACTIN 8.9 05/03/2019   Lab Results  Component Value Date   CHOL 223 (H) 04/04/2023   TRIG 85 04/04/2023   HDL 43 04/04/2023   CHOLHDL 5.2 04/04/2023   VLDL 17 04/04/2023   LDLCALC 163 (H) 04/04/2023   LDLCALC 105 (H) 06/10/2014    Physical Findings: AIMS:  , ,  ,  ,    CIWA:    COWS:     Musculoskeletal: Strength & Muscle Tone: within normal limits Gait & Station: normal Patient leans: N/A  Psychiatric Specialty Exam:  Presentation  General Appearance:  Appropriate for Environment; Casual  Eye Contact: Good  Speech: Clear and Coherent; Normal Rate  Speech Volume: Normal  Handedness: Right  Mood and Affect  Mood: Anxious; Depressed  Affect: Congruent  Thought Process  Thought Processes: Coherent; Linear  Descriptions of Associations:Intact  Orientation:Full (Time, Place and Person)  Thought Content:Logical  History of Schizophrenia/Schizoaffective disorder:No data recorded Duration of Psychotic Symptoms:No data recorded Hallucinations:Hallucinations: None  Ideas of Reference:None  Suicidal Thoughts:Suicidal Thoughts: No SI Active Intent  and/or Plan: -- (Denies)  Homicidal Thoughts:Homicidal Thoughts: No  Sensorium  Memory: Immediate Good; Recent Good  Judgment: Poor  Insight: Poor  Executive Functions  Concentration: Good  Attention Span: Good  Recall: Fair  Fund of Knowledge: Fair  Language: Good  Psychomotor Activity  Psychomotor Activity: Psychomotor Activity: Normal  Assets  Assets: Manufacturing systems engineer; Housing; Resilience; Physical Health  Sleep  Sleep: Sleep: Good Number of Hours of Sleep: 8  Physical Exam: Physical Exam Constitutional:      Appearance: Normal appearance.  HENT:     Head: Normocephalic.     Nose: Nose normal.     Mouth/Throat:     Mouth: Mucous membranes are moist.     Pharynx: Oropharynx is clear.  Eyes:     Extraocular Movements: Extraocular movements intact.     Pupils: Pupils are equal, round, and reactive to light.  Cardiovascular:     Rate and Rhythm: Normal rate.     Pulses: Normal pulses.  Pulmonary:     Effort: Pulmonary effort is normal.  Musculoskeletal:        General: Normal range of motion.     Cervical back: Normal range of motion.  Skin:    General: Skin is warm.  Neurological:     General: No focal deficit present.     Mental Status: He is alert and oriented to person, place, and time.  Psychiatric:        Behavior: Behavior normal.    Review of Systems  Constitutional:  Negative for fever.  Eyes:  Negative for blurred vision.  Respiratory:  Negative for cough.   Cardiovascular:  Negative for chest pain.  Gastrointestinal:  Negative for heartburn.  Genitourinary:  Negative for dysuria.  Musculoskeletal:  Negative for myalgias.  Skin:  Negative for rash.  Neurological:  Negative for dizziness.  Endo/Heme/Allergies:        See allergy listing  Psychiatric/Behavioral:  Positive for depression and substance abuse. Negative for hallucinations, memory loss and suicidal ideas. The patient is nervous/anxious and has insomnia.     Blood pressure (!) 148/86, pulse 71, temperature (!) 97.3 F (36.3 C), resp. rate 20, height 5\' 8"  (1.727 m), weight 108.9 kg, SpO2 99 %. Body mass index is 36.49 kg/m.  Treatment Plan Summary: Daily contact with patient to assess and evaluate symptoms and progress in treatment and Medication management   Safety and Monitoring: Voluntary admission to inpatient psychiatric unit for safety, stabilization and treatment Daily contact with patient to assess and evaluate symptoms and progress in treatment Patient's case to be discussed in multi-disciplinary team meeting Observation Level : q15 minute checks Vital signs: q12 hours Precautions: Safety   Long Term Goal(s): Improvement in symptoms so as ready for discharge   Short Term Goals: Ability to identify changes in lifestyle to reduce recurrence of condition will improve, Ability to verbalize feelings will improve, Ability to disclose and discuss suicidal ideas, Ability to demonstrate self-control will improve, Ability to identify and develop effective coping behaviors will improve, Ability to maintain clinical measurements within normal limits will improve, Compliance with prescribed medications will improve, and Ability to identify triggers associated with substance abuse/mental health issues will improve   Diagnoses Principal Problem:   MDD (major depressive disorder), recurrent episode, severe (HCC)   Medications -Continue Prozac 20 mg daily for depressive symptoms and anxiety -Continue trazodone 50 mg q. nightly for insomnia -Continue hydroxyzine 25 mg 3 times daily as needed for anxiety   Other PRNS -Continue Tylenol 650 mg every 6 hours PRN for mild pain -Continue Maalox 30 mg every 4 hrs PRN for indigestion -Continue Milk of Magnesia as needed every 6 hrs for constipation   Labs reviewed: Vitamin D level low, replenishing with Vitamin D 50,000 units.   Discharge Planning: Social work and case management to assist with  discharge planning and identification of hospital follow-up needs prior to discharge Estimated LOS: 5-7 days Discharge Concerns: Need to establish a safety plan; Medication compliance and effectiveness Discharge Goals: Return home with outpatient referrals for mental health follow-up including medication management/psychotherapy   I certify that inpatient services furnished can reasonably be expected to improve the patient's condition.    Cecilie Lowers, FNP 04/05/2023, 6:34 PMPatient ID: [name], male   DOB: May 27, 1971, 52 y.o.   MRN: [id number]

## 2023-04-05 NOTE — Progress Notes (Signed)
The Writer observed the Pt with unsteady gait in dayroom. On approach, Pt reported that he was experiencing dizziness, which he attributed to vertigo. The Writer suggested the Pt sit down to avoid a fall, though he refused. The Writer then stood by the Pt for several minutes for support and recommended he go lay down in his room until the dizziness cleared, which he also refused. Pt instead went to the cafeteria for breakfast with his gait still unsteady. RN notified.

## 2023-04-05 NOTE — [facility] Group Notes (Signed)
[facility] Group Notes:  (Nursing)  Date:  04/05/2023  Time:  1400  Type of Therapy:  Nurse Education  Participation Level:  Active  Participation Quality:  Appropriate and Attentive  Affect:  Appropriate  Cognitive:  Alert  Insight:  Improving  Engagement in Group:  Improving  Modes of Intervention:  Discussion, Education, Exploration, Problem-solving, Socialization, and Support  Summary of Progress/Problems:  Healthy relationships What makes a healthy relationship? Can you recognize an unhealthy relationship?  Shela Nevin 04/05/2023, 3:41 PM

## 2023-04-05 NOTE — Progress Notes (Signed)
   04/04/23 2140  Psych Admission Type (Psych Patients Only)  Admission Status Involuntary  Psychosocial Assessment  Patient Complaints Depression  Eye Contact Brief  Facial Expression Flat  Affect Depressed  Speech Logical/coherent;Soft  Interaction Cautious  Motor Activity Slow  Appearance/Hygiene Unremarkable  Behavior Characteristics Cooperative;Appropriate to situation  Mood Depressed  Thought Process  Coherency WDL  Content Blaming others  Delusions None reported or observed  Perception WDL  Hallucination None reported or observed  Judgment Impaired  Confusion None  Danger to Self  Current suicidal ideation? Denies  Agreement Not to Harm Self Yes  Description of Agreement verbal  Danger to Others  Danger to Others None reported or observed    Pt was offered support and encouragement. Pt refused scheduled Trazodone, reported that he is able to sleep well without medication. Pt was encouraged to attend group. Q 15 minute checks were done for safety. Pt attended group and interacts well with peers and staff. Pt receptive to treatment and safety maintained on unit.

## 2023-04-05 NOTE — Progress Notes (Signed)
   04/05/23 0559  15 Minute Checks  Location Bedroom  Visual Appearance Calm  Behavior Sleeping  Sleep (Behavioral Health Patients Only)  Calculate sleep? (Click Yes once per 24 hr at 0600 safety check) Yes  Documented sleep last 24 hours 8

## 2023-04-05 NOTE — [facility] Group Notes (Signed)
Adult Psychoeducational Group Note  Date:  04/05/2023 Time:  10:13 PM  Group Topic/Focus:  Wrap-Up Group:   The focus of this group is to help patients review their daily goal of treatment and discuss progress on daily workbooks.  Participation Level:  Active  Participation Quality:  Appropriate  Affect:  Appropriate  Cognitive:  Appropriate  Insight: Appropriate  Engagement in Group:  Engaged  Modes of Intervention:  Discussion  Additional Comments:   Pt states that he had a good day and was able to social a lot today. Pt states he had a good conversation with the NP who said that she feels he's ready to go home. Pt is excited and feels prepared. Pt denies everything   Roy Jackson 04/05/2023, 10:13 PM

## 2023-04-05 NOTE — Group Note (Signed)
[facility] LCSW Group Therapy Note    Group Date: 04/05/2023 Start Time: 1000 End Time: 1100  Type of Therapy and Topic:  Group Therapy:  Overcoming Obstacles  Participation Level:  [facility] PARTICIPATION LEVEL: Active  Mood: Frustrated  Description of Group:   In this group patients will be encouraged to explore what they see as obstacles to their own wellness and recovery. They will be guided to discuss their thoughts, feelings, and behaviors related to these obstacles. The group will process together ways to cope with barriers, with attention given to specific choices patients can make. Each patient will be challenged to identify changes they are motivated to make in order to overcome their obstacles. This group will be process-oriented, with patients participating in exploration of their own experiences as well as giving and receiving support and challenge from other group members.  Therapeutic Goals: 1. Patient will identify personal and current obstacles as they relate to admission. 2. Patient will identify barriers that currently interfere with their wellness or overcoming obstacles.  3. Patient will identify feelings, thought process and behaviors related to these barriers. 4. Patient will identify two changes they are willing to make to overcome these obstacles:    Summary of Patient Progress Pt attended and participated in group. Pt shares frustrations with being in the hospital and feeling the environment is not conducive to his mental health. Pt shares coping skills he has at home will not work in this environment. Pt was able to vent and add to group conversation is a healthy way.    Therapeutic Modalities:   Cognitive Behavioral Therapy Solution Focused Therapy Motivational Interviewing Relapse Prevention Therapy   Otelia Santee, LCSW

## 2023-04-05 NOTE — Progress Notes (Signed)
Pt verbalized experiencing vertigo in the AM. Pt appeared to be wobbly on feet. Pt endorsed that he has experienced vertigo in the past. Staff encouraged pt to stay back, rest, and sit in place instead of going to the cafeteria. Educated pt on potential safety concerns associated with going to the cafeteria. Pt insisted on going to the cafeteria.

## 2023-04-05 NOTE — Progress Notes (Signed)
   04/05/23 0800  Psych Admission Type (Psych Patients Only)  Admission Status Involuntary  Psychosocial Assessment  Patient Complaints Depression  Eye Contact Brief  Facial Expression Flat  Affect Depressed  Speech Logical/coherent  Interaction Cautious  Motor Activity Slow  Appearance/Hygiene Unremarkable  Behavior Characteristics Cooperative;Appropriate to situation  Mood Depressed  Thought Process  Coherency WDL  Content Blaming others  Delusions None reported or observed  Perception WDL  Hallucination None reported or observed  Judgment Impaired  Confusion None  Danger to Self  Current suicidal ideation? Denies  Self-Injurious Behavior No self-injurious ideation or behavior indicators observed or expressed   Agreement Not to Harm Self Yes  Description of Agreement Verbal  Danger to Others  Danger to Others None reported or observed

## 2023-04-06 DIAGNOSIS — Z9151 Personal history of suicidal behavior: Secondary | ICD-10-CM | POA: Diagnosis not present

## 2023-04-06 DIAGNOSIS — F332 Major depressive disorder, recurrent severe without psychotic features: Secondary | ICD-10-CM | POA: Diagnosis present

## 2023-04-06 DIAGNOSIS — Z79899 Other long term (current) drug therapy: Secondary | ICD-10-CM | POA: Diagnosis not present

## 2023-04-06 DIAGNOSIS — K5900 Constipation, unspecified: Secondary | ICD-10-CM | POA: Diagnosis present

## 2023-04-06 DIAGNOSIS — I10 Essential (primary) hypertension: Secondary | ICD-10-CM | POA: Diagnosis present

## 2023-04-06 DIAGNOSIS — K30 Functional dyspepsia: Secondary | ICD-10-CM | POA: Diagnosis present

## 2023-04-06 DIAGNOSIS — G4700 Insomnia, unspecified: Secondary | ICD-10-CM | POA: Diagnosis present

## 2023-04-06 DIAGNOSIS — Z87891 Personal history of nicotine dependence: Secondary | ICD-10-CM | POA: Diagnosis not present

## 2023-04-06 DIAGNOSIS — F411 Generalized anxiety disorder: Secondary | ICD-10-CM | POA: Diagnosis present

## 2023-04-06 LAB — HEMOGLOBIN A1C
Hgb A1c MFr Bld: 5.7 % — ABNORMAL HIGH (ref 4.8–5.6)
Mean Plasma Glucose: 117 mg/dL

## 2023-04-06 MED ORDER — AMLODIPINE BESYLATE 5 MG PO TABS
5.0000 mg | ORAL_TABLET | Freq: Every day | ORAL | Status: DC
  Administered 2023-04-06 – 2023-04-08 (×3): 5 mg via ORAL
  Filled 2023-04-06 (×4): qty 1

## 2023-04-06 MED ORDER — TRAZODONE HCL 50 MG PO TABS
50.0000 mg | ORAL_TABLET | Freq: Every evening | ORAL | Status: DC | PRN
  Administered 2023-04-06: 50 mg via ORAL
  Filled 2023-04-06: qty 1
  Filled 2023-04-06: qty 7

## 2023-04-06 NOTE — [facility] Group Notes (Signed)
Adult Psychoeducational Group Note  Date:  04/06/2023 Time:  9:49 PM  Group Topic/Focus:  Wrap-Up Group:   The focus of this group is to help patients review their daily goal of treatment and discuss progress on daily workbooks.  Participation Level:  Active  Participation Quality:  Appropriate  Affect:  Appropriate  Cognitive:  Appropriate  Insight: Appropriate  Engagement in Group:  Engaged  Modes of Intervention:  Education  Additional Comments:  Pt said he had a ok day. He said he has been trying to talk with the doctor all day but the doctor been running away from him. Pt wants to work on getting out of here tomorrow. Pt rated his day an 4.  Wallis Spizzirri, Sharen Counter 04/06/2023, 9:49 PM

## 2023-04-06 NOTE — Plan of Care (Signed)
  Problem: Education: Goal: Knowledge of  General Education information/materials will improve Outcome: Progressing Goal: Emotional status will improve Outcome: Progressing Goal: Mental status will improve Outcome: Progressing Goal: Verbalization of understanding the information provided will improve Outcome: Progressing   Problem: Safety: Goal: Periods of time without injury will increase Outcome: Progressing   

## 2023-04-06 NOTE — Progress Notes (Signed)
   04/05/23 2200  Psych Admission Type (Psych Patients Only)  Admission Status Involuntary  Psychosocial Assessment  Patient Complaints None  Eye Contact Fair  Facial Expression Flat  Affect Depressed  Speech Logical/coherent;Soft  Interaction Assertive  Motor Activity Slow  Appearance/Hygiene Unremarkable  Behavior Characteristics Cooperative;Appropriate to situation  Mood Depressed  Thought Process  Coherency WDL  Content Blaming others  Delusions None reported or observed  Perception WDL  Hallucination None reported or observed  Judgment Impaired  Confusion None  Danger to Self  Current suicidal ideation? Denies  Agreement Not to Harm Self Yes  Description of Agreement verbal  Danger to Others  Danger to Others None reported or observed   Pt was offered support and encouragement. Pt refused scheduled Trazodone tonight. Q 15 minute checks were done for safety. Pt attended group and interacts well with peers and staff. Pt has no complaints.Pt receptive to treatment and safety maintained on unit.

## 2023-04-06 NOTE — Group Note (Signed)
Recreation Therapy Group Note   Group Topic:Problem Solving  Group Date: 04/06/2023 Start Time: 0935 End Time: 1010 Facilitators: Uchechi Denison-McCall, LRT,CTRS Location: [address] Dayroom   Goal Area(s) Addresses:  Patient will effectively work with peer towards shared goal.  Patient will identify skills used to make activity successful.  Patient will identify how skills used during activity can be used to reach post d/c goals.   Group Description: Landing Pad. In teams of 3-5, patients were given 12 plastic drinking straws and an equal length of masking tape. Using the materials provided, patients were asked to build a landing pad to catch a golf ball dropped from approximately 5 feet in the air. All materials were required to be used by the team in their design. LRT facilitated post-activity discussion.   Affect/Mood: Appropriate   Participation Level: Engaged   Participation Quality: Independent   Behavior: Appropriate   Speech/Thought Process: Focused   Insight: Good   Judgement: Good   Modes of Intervention: STEM Activity   Patient Response to Interventions:  Engaged   Education Outcome:  Acknowledges education   Clinical Observations/Individualized Feedback: Pt attended and participated in group session. Pt was more engaged and social during group session.     Plan: Continue to engage patient in RT group sessions 2-3x/week.   Roy Jackson, LRT,CTRS 04/06/2023 12:40 PM

## 2023-04-06 NOTE — Progress Notes (Signed)
[facility] MD Progress Note  04/06/2023 1:50 PM Roy Jackson  MRN:  [id number] Principal Problem: MDD (major depressive disorder), recurrent severe, without psychosis (HCC) Diagnosis: Principal Problem:   MDD (major depressive disorder), recurrent severe, without psychosis (HCC) Active Problems:   Vitamin D deficiency   Generalized anxiety disorder  Reason for Admission: Roy Jackson is a 52 yo AAM with a prior history of MDD who presented to the [facility] under IVC taken out by law enforcement after he was found in bed with a loaded gun, 7 cans of beer, an empty bottle of wine, a bottle of alcohol and sleeping pills. He told law enforcement that he drank a cup of bleach and ate roach killer as per documentation from the [facility]. Pt was found after his friend called law enforcement for a wellness check when he sent messages to friend to state say goodbye ,and that he would not be seeing him anymore.  Patient was transferred to this behavioral health Hospital for treatment and stabilization of her mental status.  Hospital Course: Sleep Hours last night: Slept 8 hrs as per nursing documentation, refused scheduled dose of trazodone 50 mg.  Also refusing nicotine patch as per the MAR. Nursing Concerns: None reported/documented Behavioral episodes in the past 24 hrs: As above Medication Compliance: Took the Prozac today Vital Signs in the past 24 hrs: Blood pressure very elevated earlier today morning at 170/91, rechecked and was 163/99.  Information from in nursing flow sheets, patient's assigned RN asked to recheck vital signs.  Patient is asymptomatic. PRN Medications in the past 24 hrs: None  Patient assessment note: Mood has improved since hospitalization, but remains depressed, and patient is making improvements via attending unit group sessions.  He is visible to be in the day room interacting with his peers and staff.  He however continues to have poor  insight into the reasons leading to this hospitalization, and regarding the fact that he is in need of treatment to stabilize his mental status.  During encounter with patient, he perseverates about wanting to be discharged, because he will lose custody of his 42-year-old son if he remains hospitalized.  When asked where the 20-year-old resides, patient states that he has been residing with a family friend since he was 23-year-old, because his mother is a substance abuser.  Patient denies SI/HI/AVH, and denies paranoia.  There is no evidence of delusional thinking. Overall, there has been an improvement in mood.   Patient reports a good sleep quality last night, even though he does not look well rested, he reports a good appetite, and denies being in any physical pain. Education reiterated about the fact that his suicide attempt prior to this hospitalization, could have been fatal, but he continues to come up rationales to explain why he took the substances prior to this hospitalization. Insight & judgment remains poor.  Projected discharge date for patient is Wednesday, July 3rd, 2024, pending safety planning being completed by CSW and outpatient follow-up appointments being made.  We will continue medications as listed below, and change trazodone to PRN since pt has been refusing it. We will add Norvasc 5 mg daily for hypertension and continue other medications as listed below.  Patient has been educated on the rationales, benefits, and possible side effects of all medications.  He has also been educated on the fact that it is his right to refuse medications.  Total Time spent with patient: 45 minutes  Past Psychiatric History:  See H & P  Past Medical History:  Past Medical History:  Diagnosis Date   Chronic headaches    History of hypogonadism 03/28/2019   OSA (obstructive sleep apnea) 11/12/2017   Per sleep study 10/2017    Past Surgical History:  Procedure Laterality Date   HERNIA REPAIR      TONSILLECTOMY     Family History:  Family History  Problem Relation Age of Onset   Heart attack Other        maternal GF died in 21s, maternal uncles x 2 in 57s    Hypertension Paternal Grandmother    Hypertension Paternal Grandfather    Diabetes Paternal Grandfather    Family Psychiatric  History: See H & P Social History:  Social History   Substance and Sexual Activity  Alcohol Use Not Currently   Comment: occ     Social History   Substance and Sexual Activity  Drug Use No    Social History   Socioeconomic History   Marital status: Married    Spouse name: Not on file   Number of children: 2   Years of education: 12   Highest education level: Not on file  Occupational History   Not on file  Tobacco Use   Smoking status: Former    Types: Cigarettes   Smokeless tobacco: Never   Tobacco comments:    last one 2-3 months ago  Vaping Use   Vaping Use: Never used  Substance and Sexual Activity   Alcohol use: Not Currently    Comment: occ   Drug use: No   Sexual activity: Not Currently    Partners: Female  Other Topics Concern   Not on file  Social History Narrative   Smoking 1/2 pk per week    leave wife   2-story home   Soda 1-2 a day   11th grade   Social Determinants of Health   Financial Resource Strain: Not on file  Food Insecurity: Patient Declined (04/03/2023)   Hunger Vital Sign    Worried About Running Out of Food in the Last Year: Patient declined    Ran Out of Food in the Last Year: Patient declined  Transportation Needs: No Transportation Needs (04/03/2023)   PRAPARE - Administrator, Civil Service (Medical): No    Lack of Transportation (Non-Medical): No  Physical Activity: Not on file  Stress: Not on file  Social Connections: Not on file   Sleep: Good  Appetite:  Good  Current Medications: Current Facility-Administered Medications  Medication Dose Route Frequency Provider Last Rate Last Admin   alum & mag hydroxide-simeth  (MAALOX/MYLANTA) 200-200-20 MG/5ML suspension 30 mL  30 mL Oral Q4H PRN Lenox Ponds, NP       amLODipine (NORVASC) tablet 5 mg  5 mg Oral Daily Kele Withem, NP       diphenhydrAMINE (BENADRYL) capsule 50 mg  50 mg Oral TID PRN Lenox Ponds, NP       Or   diphenhydrAMINE (BENADRYL) injection 50 mg  50 mg Intramuscular TID PRN Lenox Ponds, NP       FLUoxetine (PROZAC) capsule 20 mg  20 mg Oral Daily Starleen Blue, NP   20 mg at 04/06/23 1610   haloperidol (HALDOL) tablet 5 mg  5 mg Oral TID PRN Lenox Ponds, NP       Or   haloperidol lactate (HALDOL) injection 5 mg  5 mg Intramuscular TID PRN Lenox Ponds, NP  hydrOXYzine (ATARAX) tablet 25 mg  25 mg Oral TID PRN Starleen Blue, NP       LORazepam (ATIVAN) tablet 2 mg  2 mg Oral TID PRN Lenox Ponds, NP       Or   LORazepam (ATIVAN) injection 2 mg  2 mg Intramuscular TID PRN Lenox Ponds, NP       magnesium hydroxide (MILK OF MAGNESIA) suspension 30 mL  30 mL Oral Daily PRN Lenox Ponds, NP       traZODone (DESYREL) tablet 50 mg  50 mg Oral QHS PRN Starleen Blue, NP       Vitamin D (Ergocalciferol) (DRISDOL) 1.25 MG (50000 UNIT) capsule 50,000 Units  50,000 Units Oral Q7 days Starleen Blue, NP   50,000 Units at 04/04/23 1259    Lab Results:  No results found for this or any previous visit (from the past 48 hour(s)).   Blood Alcohol level:  Lab Results  Component Value Date   ETH <10 04/02/2023    Metabolic Disorder Labs: Lab Results  Component Value Date   HGBA1C 5.7 (H) 04/04/2023   MPG 117 04/04/2023   MPG 111 06/09/2014   Lab Results  Component Value Date   PROLACTIN 8.9 05/03/2019   Lab Results  Component Value Date   CHOL 223 (H) 04/04/2023   TRIG 85 04/04/2023   HDL 43 04/04/2023   CHOLHDL 5.2 04/04/2023   VLDL 17 04/04/2023   LDLCALC 163 (H) 04/04/2023   LDLCALC 105 (H) 06/10/2014    Physical Findings: AIMS:  , ,  ,  ,    CIWA:    COWS:      Musculoskeletal: Strength & Muscle Tone: within normal limits Gait & Station: normal Patient leans: N/A  Psychiatric Specialty Exam:  Presentation  General Appearance:  Fairly Groomed  Eye Contact: Fair  Speech: Clear and Coherent  Speech Volume: Normal  Handedness: Right   Mood and Affect  Mood: Anxious; Depressed  Affect: Congruent   Thought Process  Thought Processes: Coherent  Descriptions of Associations:Intact  Orientation:Full (Time, Place and Person)  Thought Content:Illogical  History of Schizophrenia/Schizoaffective disorder:No data recorded Duration of Psychotic Symptoms:No data recorded Hallucinations:Hallucinations: None  Ideas of Reference:None  Suicidal Thoughts:Suicidal Thoughts: No SI Active Intent and/or Plan: -- (Denies)  Homicidal Thoughts:Homicidal Thoughts: No   Sensorium  Memory: Immediate Good  Judgment: Poor  Insight: Poor   Executive Functions  Concentration: Fair  Attention Span: Fair  Recall: [employer] of Knowledge: Fair  Language: Fair   Psychomotor Activity  Psychomotor Activity: Psychomotor Activity: Normal   Assets  Assets: Resilience   Sleep  Sleep: Sleep: Good Number of Hours of Sleep: 8    Physical Exam: Physical Exam Constitutional:      Appearance: Normal appearance.  Musculoskeletal:        General: Normal range of motion.     Cervical back: Normal range of motion.  Neurological:     Mental Status: He is alert and oriented to person, place, and time.    Review of Systems  Constitutional:  Negative for fever.  Eyes:  Negative for blurred vision.  Respiratory:  Negative for cough.   Cardiovascular:  Negative for chest pain.  Gastrointestinal:  Negative for heartburn.  Genitourinary:  Negative for dysuria.  Musculoskeletal:  Negative for myalgias.  Skin:  Negative for rash.  Neurological:  Negative for dizziness.  Psychiatric/Behavioral:  Positive for  depression and substance abuse. Negative for hallucinations, memory loss and suicidal  ideas. The patient is nervous/anxious and has insomnia.    Blood pressure (!) 153/92, pulse 78, temperature 98.2 F (36.8 C), resp. rate 20, height 5\' 8"  (1.727 m), weight 108.9 kg, SpO2 99 %. Body mass index is 36.49 kg/m.  Treatment Plan Summary: Daily contact with patient to assess and evaluate symptoms and progress in treatment and Medication management   Safety and Monitoring: Voluntary admission to inpatient psychiatric unit for safety, stabilization and treatment Daily contact with patient to assess and evaluate symptoms and progress in treatment Patient's case to be discussed in multi-disciplinary team meeting Observation Level : q15 minute checks Vital signs: q12 hours Precautions: Safety   Long Term Goal(s): Improvement in symptoms so as ready for discharge   Short Term Goals: Ability to identify changes in lifestyle to reduce recurrence of condition will improve, Ability to verbalize feelings will improve, Ability to disclose and discuss suicidal ideas, Ability to demonstrate self-control will improve, Ability to identify and develop effective coping behaviors will improve, Ability to maintain clinical measurements within normal limits will improve, Compliance with prescribed medications will improve, and Ability to identify triggers associated with substance abuse/mental health issues will improve   Diagnoses Principal Problem:   MDD (major depressive disorder), recurrent episode, severe (HCC)   Medications -Start Norvasc 5 mg daily for hypertension -Discontinue Nicotine patch as per pt's request -Continue Prozac 20 mg daily for depressive symptoms and anxiety -Continue trazodone 50 mg and switch to PRN for sleep -Continue hydroxyzine 25 mg 3 times daily as needed for anxiety   Other PRNS -Continue Tylenol 650 mg every 6 hours PRN for mild pain -Continue Maalox 30 mg every 4 hrs PRN for  indigestion -Continue Milk of Magnesia as needed every 6 hrs for constipation   Labs reviewed: Vitamin D level low, replenishing with Vitamin D 50,000 units.   Discharge Planning: Social work and case management to assist with discharge planning and identification of hospital follow-up needs prior to discharge Estimated LOS: 5-7 days Discharge Concerns: Need to establish a safety plan; Medication compliance and effectiveness Discharge Goals: Return home with outpatient referrals for mental health follow-up including medication management/psychotherapy   I certify that inpatient services furnished can reasonably be expected to improve the patient's condition.    Starleen Blue, NP 04/06/2023, 1:50 PMPatient ID: Roy Jackson, male   DOB: 08-21-1971, 52 y.o.   MRN: [id number]

## 2023-04-06 NOTE — [facility] Suicide Risk Assessment (Signed)
[facility] INPATIENT:  Family/Significant Other Suicide Prevention Education  Suicide Prevention Education:  Education Completed; Roy Jackson [phone],  (name of family member/significant other) has been identified by the patient as the family member/significant other with whom the patient will be residing, and identified as the person(s) who will aid the patient in the event of a mental health crisis (suicidal ideations/suicide attempt).  With written consent from the patient, the family member/significant other has been provided the following suicide prevention education, prior to the and/or following the discharge of the patient.   CSW did safety planning was patient Roy Jackson. Roy Jackson shared that patient was having issues with his ex-wife who left him and all he wanted was closure from her . Then states that his dog passed last week and he really lost it afterwards stating, " I let my dog down chasing women ". Friend continued on saying that patient has loss other family members in the beginning of this year . Friend said that he did not think that patient would do something to himself but ended up having his wife call the police to do a wellness check in patient. Friend stated that they have been friends for over 3- something years, he wants the best for him, and he will pick him up upon DC. Friend also confirmed that he has removed all the guns in patient home to his house.     The suicide prevention education provided includes the following: Suicide risk factors Suicide prevention and interventions National Suicide Hotline telephone number [facility] assessment telephone number [facility] Emergency Assistance 911 [facility] and/or Residential Mobile Crisis Unit telephone number  Request made of family/significant other to: Remove weapons (e.g., guns, rifles, knives), all items previously/currently identified as safety concern.   Remove drugs/medications  (over-the-counter, prescriptions, illicit drugs), all items previously/currently identified as a safety concern.  The family member/significant other verbalizes understanding of the suicide prevention education information provided.  The family member/significant other agrees to remove the items of safety concern listed above.  Roy Jackson 04/06/2023, 4:03 PM

## 2023-04-06 NOTE — Progress Notes (Signed)
   04/06/23 0559  15 Minute Checks  Location Bedroom  Visual Appearance Calm  Behavior Sleeping  Sleep (Behavioral Health Patients Only)  Calculate sleep? (Click Yes once per 24 hr at 0600 safety check) Yes  Documented sleep last 24 hours 8

## 2023-04-07 DIAGNOSIS — F332 Major depressive disorder, recurrent severe without psychotic features: Secondary | ICD-10-CM | POA: Diagnosis not present

## 2023-04-07 NOTE — Progress Notes (Signed)
   04/07/23 0559  15 Minute Checks  Location Bedroom  Visual Appearance Calm  Behavior Sleeping  Sleep (Behavioral Health Patients Only)  Calculate sleep? (Click Yes once per 24 hr at 0600 safety check) Yes  Documented sleep last 24 hours 7.25

## 2023-04-07 NOTE — Group Note (Signed)
Date:  04/07/2023 Time:  8:45 PM  Group Topic/Focus:  Wrap-Up Group:   The focus of this group is to help patients review their daily goal of treatment and discuss progress on daily workbooks.    Participation Level:  Active  Participation Quality:  Appropriate  Affect:  Appropriate  Cognitive:  Appropriate  Insight: Appropriate  Engagement in Group:  Engaged  Modes of Intervention:  Socialization  Additional Comments:  He rated his day to be 7 out of 10. He said he is happy to find out about his discharge and setting his goal tomorrow to going home  Roy Jackson [name] 04/07/2023, 8:45 PM

## 2023-04-07 NOTE — Progress Notes (Signed)
   04/06/23 2150  Psych Admission Type (Psych Patients Only)  Admission Status Involuntary  Psychosocial Assessment  Patient Complaints None  Eye Contact Fair  Facial Expression Flat  Affect Depressed  Speech Logical/coherent  Interaction Assertive  Motor Activity Slow  Appearance/Hygiene Unremarkable  Behavior Characteristics Cooperative;Appropriate to situation  Mood Depressed;Pleasant  Thought Process  Coherency WDL  Content Blaming others  Delusions None reported or observed  Perception WDL  Hallucination None reported or observed  Judgment Impaired  Confusion None  Danger to Self  Current suicidal ideation? Denies  Agreement Not to Harm Self Yes  Description of Agreement verbal  Danger to Others  Danger to Others None reported or observed   Pt is pleasant and cooperative. Pt was offered support and encouragement. Pt was given PRN Trazodone per MAR. Q 15 minute checks were done for safety. Pt attended group and interacts well with peers and staff. Pt receptive to treatment and safety maintained on unit.

## 2023-04-07 NOTE — Progress Notes (Signed)
[facility] MD Progress Note  04/07/2023 1:01 PM Roy Jackson  MRN:  [id number] Principal Problem: MDD (major depressive disorder), recurrent severe, without psychosis (HCC) Diagnosis: Principal Problem:   MDD (major depressive disorder), recurrent severe, without psychosis (HCC) Active Problems:   Vitamin D deficiency   Generalized anxiety disorder  Reason for Admission: Roy Jackson is a 52 yo AAM with a prior history of MDD who presented to the [facility]) under IVC taken out by law enforcement after he was found in bed with a loaded gun, 7 cans of beer, an empty bottle of wine, a bottle of alcohol and sleeping pills. He told law enforcement that he drank a cup of bleach and ate roach killer as per documentation from the [facility]. Pt was found after his friend called law enforcement for a wellness check when he sent messages to friend to state say goodbye ,and that he would not be seeing him anymore.  Patient was transferred to this behavioral health Hospital for treatment and stabilization of her mental status.  Hospital Course: Sleep Hours last night: Slept 7.25 hrs as per nursing documentation, stated that Trazodone kept him awake and caused him insomnia. Nursing Concerns: None reported/documented Behavioral episodes in the past 24 hrs: As above Medication Compliance: Compliant and educated on the need for compliance after discharge from the hospital. Vital Signs in the past 24 hrs: WNL with the addition of the Norvasc 5 mg daily. PRN Medications in the past 24 hrs: Trazodone 50 mg last HS  Patient assessment note: Mood has significantly improved since hospitalization as per both objective and subjective assessments of patient. He is observed to be in the day room smiling and interacting appropriately with his peers. His attention to personal hygiene and grooming is fair, eye contact is good, speech is clear & coherent. Thought contents are organized and  logical, and pt currently denies SI/HI/AVH or paranoia. There is no evidence of delusional thoughts.    Pt reports a good sleep quality last night, reports a good appetite, reports that he is tolerating Prozac well with no side effects, and denies being in any physical pain at this time. He reports motivation to continue mental health care on an outpatient basis with a goal of continuous betterment in his mental health. Projected discharge date is tomorrow, 7/3 as long as discharge safety planning is completed and f/u appointments for continuity of care outside the hospital made. We are continuing medications as listed below, as pt is resistant to any increases in the doses of his medications.   Total Time spent with patient: 45 minutes  Past Psychiatric History: See H & P  Past Medical History:  Past Medical History:  Diagnosis Date   Chronic headaches    History of hypogonadism 03/28/2019   OSA (obstructive sleep apnea) 11/12/2017   Per sleep study 10/2017    Past Surgical History:  Procedure Laterality Date   HERNIA REPAIR     TONSILLECTOMY     Family History:  Family History  Problem Relation Age of Onset   Heart attack Other        maternal GF died in 63s, maternal uncles x 2 in 84s    Hypertension Paternal Grandmother    Hypertension Paternal Grandfather    Diabetes Paternal Grandfather    Family Psychiatric  History: See H & P Social History:  Social History   Substance and Sexual Activity  Alcohol Use Not Currently   Comment: occ  Social History   Substance and Sexual Activity  Drug Use No    Social History   Socioeconomic History   Marital status: Married    Spouse name: Not on file   Number of children: 2   Years of education: 12   Highest education level: Not on file  Occupational History   Not on file  Tobacco Use   Smoking status: Former    Types: Cigarettes   Smokeless tobacco: Never   Tobacco comments:    last one 2-3 months ago  Vaping Use    Vaping Use: Never used  Substance and Sexual Activity   Alcohol use: Not Currently    Comment: occ   Drug use: No   Sexual activity: Not Currently    Partners: Female  Other Topics Concern   Not on file  Social History Narrative   Smoking 1/2 pk per week    leave wife   2-story home   Soda 1-2 a day   11th grade   Social Determinants of Health   Financial Resource Strain: Not on file  Food Insecurity: Patient Declined (04/03/2023)   Hunger Vital Sign    Worried About Running Out of Food in the Last Year: Patient declined    Ran Out of Food in the Last Year: Patient declined  Transportation Needs: No Transportation Needs (04/03/2023)   PRAPARE - Administrator, Civil Service (Medical): No    Lack of Transportation (Non-Medical): No  Physical Activity: Not on file  Stress: Not on file  Social Connections: Not on file   Sleep: Good  Appetite:  Good  Current Medications: Current Facility-Administered Medications  Medication Dose Route Frequency Provider Last Rate Last Admin   alum & mag hydroxide-simeth (MAALOX/MYLANTA) 200-200-20 MG/5ML suspension 30 mL  30 mL Oral Q4H PRN Lenox Ponds, NP       amLODipine (NORVASC) tablet 5 mg  5 mg Oral Daily Starleen Blue, NP   5 mg at 04/07/23 0756   diphenhydrAMINE (BENADRYL) capsule 50 mg  50 mg Oral TID PRN Lenox Ponds, NP       Or   diphenhydrAMINE (BENADRYL) injection 50 mg  50 mg Intramuscular TID PRN Lenox Ponds, NP       FLUoxetine (PROZAC) capsule 20 mg  20 mg Oral Daily Starleen Blue, NP   20 mg at 04/07/23 0757   haloperidol (HALDOL) tablet 5 mg  5 mg Oral TID PRN Lenox Ponds, NP       Or   haloperidol lactate (HALDOL) injection 5 mg  5 mg Intramuscular TID PRN Lenox Ponds, NP       hydrOXYzine (ATARAX) tablet 25 mg  25 mg Oral TID PRN Starleen Blue, NP       LORazepam (ATIVAN) tablet 2 mg  2 mg Oral TID PRN Lenox Ponds, NP       Or   LORazepam (ATIVAN) injection 2 mg  2 mg  Intramuscular TID PRN Lenox Ponds, NP       magnesium hydroxide (MILK OF MAGNESIA) suspension 30 mL  30 mL Oral Daily PRN Lenox Ponds, NP       traZODone (DESYREL) tablet 50 mg  50 mg Oral QHS PRN Starleen Blue, NP   50 mg at 04/06/23 2238   Vitamin D (Ergocalciferol) (DRISDOL) 1.25 MG (50000 UNIT) capsule 50,000 Units  50,000 Units Oral Q7 days Starleen Blue, NP   50,000 Units at 04/04/23 1259  Lab Results:  No results found for this or any previous visit (from the past 48 hour(s)).   Blood Alcohol level:  Lab Results  Component Value Date   ETH <10 04/02/2023    Metabolic Disorder Labs: Lab Results  Component Value Date   HGBA1C 5.7 (H) 04/04/2023   MPG 117 04/04/2023   MPG 111 06/09/2014   Lab Results  Component Value Date   PROLACTIN 8.9 05/03/2019   Lab Results  Component Value Date   CHOL 223 (H) 04/04/2023   TRIG 85 04/04/2023   HDL 43 04/04/2023   CHOLHDL 5.2 04/04/2023   VLDL 17 04/04/2023   LDLCALC 163 (H) 04/04/2023   LDLCALC 105 (H) 06/10/2014    Physical Findings: AIMS:  , ,  ,  ,    CIWA:    COWS:     Musculoskeletal: Strength & Muscle Tone: within normal limits Gait & Station: normal Patient leans: N/A  Psychiatric Specialty Exam:  Presentation  General Appearance:  Appropriate for Environment  Eye Contact: Good  Speech: Clear and Coherent  Speech Volume: Normal  Handedness: Right   Mood and Affect  Mood: Euthymic  Affect: Congruent   Thought Process  Thought Processes: Coherent  Descriptions of Associations:Intact  Orientation:Full (Time, Place and Person)  Thought Content:WDL  History of Schizophrenia/Schizoaffective disorder:No data recorded Duration of Psychotic Symptoms:No data recorded Hallucinations:Hallucinations: None  Ideas of Reference:None  Suicidal Thoughts:Suicidal Thoughts: No  Homicidal Thoughts:Homicidal Thoughts: No   Sensorium  Memory: Immediate  Good  Judgment: Fair  Insight: Fair   Art therapist  Concentration: Fair  Attention Span: Fair  Recall: [employer] of Knowledge: Fair  Language: Fair   Psychomotor Activity  Psychomotor Activity: Psychomotor Activity: Normal   Assets  Assets: Resilience   Sleep  Sleep: Sleep: Good    Physical Exam: Physical Exam Constitutional:      Appearance: Normal appearance.  Musculoskeletal:        General: Normal range of motion.     Cervical back: Normal range of motion.  Neurological:     Mental Status: He is alert and oriented to person, place, and time.    Review of Systems  Constitutional:  Negative for fever.  Eyes:  Negative for blurred vision.  Respiratory:  Negative for cough.   Cardiovascular:  Negative for chest pain.  Gastrointestinal:  Negative for heartburn.  Genitourinary:  Negative for dysuria.  Musculoskeletal:  Negative for myalgias.  Skin:  Negative for rash.  Neurological:  Negative for dizziness.  Psychiatric/Behavioral:  Positive for depression and substance abuse. Negative for hallucinations, memory loss and suicidal ideas. The patient is nervous/anxious and has insomnia.    Blood pressure 121/74, pulse 62, temperature 97.7 F (36.5 C), temperature source Oral, resp. rate 20, height 5\' 8"  (1.727 m), weight 108.9 kg, SpO2 98 %. Body mass index is 36.49 kg/m.  Treatment Plan Summary: Daily contact with patient to assess and evaluate symptoms and progress in treatment and Medication management   Safety and Monitoring: Voluntary admission to inpatient psychiatric unit for safety, stabilization and treatment Daily contact with patient to assess and evaluate symptoms and progress in treatment Patient's case to be discussed in multi-disciplinary team meeting Observation Level : q15 minute checks Vital signs: q12 hours Precautions: Safety   Long Term Goal(s): Improvement in symptoms so as ready for discharge   Short Term  Goals: Ability to identify changes in lifestyle to reduce recurrence of condition will improve, Ability to verbalize feelings will improve, Ability  to disclose and discuss suicidal ideas, Ability to demonstrate self-control will improve, Ability to identify and develop effective coping behaviors will improve, Ability to maintain clinical measurements within normal limits will improve, Compliance with prescribed medications will improve, and Ability to identify triggers associated with substance abuse/mental health issues will improve   Diagnoses Principal Problem:   MDD (major depressive disorder), recurrent episode, severe (HCC)   Medications -Continue Norvasc 5 mg daily for hypertension -Previously discontinued Nicotine patch as per pt's request -Continue Prozac 20 mg daily for depressive symptoms and anxiety -Continue trazodone 50 mg and switch to PRN for sleep -Continue hydroxyzine 25 mg 3 times daily as needed for anxiety   Other PRNS -Continue Tylenol 650 mg every 6 hours PRN for mild pain -Continue Maalox 30 mg every 4 hrs PRN for indigestion -Continue Milk of Magnesia as needed every 6 hrs for constipation   Labs reviewed: Vitamin D level low, replenishing with Vitamin D 50,000 units.   Discharge Planning: Social work and case management to assist with discharge planning and identification of hospital follow-up needs prior to discharge Estimated LOS: 5-7 days Discharge Concerns: Need to establish a safety plan; Medication compliance and effectiveness Discharge Goals: Return home with outpatient referrals for mental health follow-up including medication management/psychotherapy   I certify that inpatient services furnished can reasonably be expected to improve the patient's condition.    Starleen Blue, NP 04/07/2023, 1:01 PMPatient ID: Roy Jackson, male   DOB: Jul 03, 1971, 52 y.o.   MRN: [id number] Patient ID: Roy Jackson, male   DOB: May 22, 1971, 52 y.o.   MRN: [id number]

## 2023-04-07 NOTE — Progress Notes (Signed)
Adult Psychoeducational Group Note  Date:  04/07/2023 Time:  1:15 PM  Group Topic/Focus:  Goals Group:   The focus of this group is to help patients establish daily goals to achieve during treatment and discuss how the patient can incorporate goal setting into their daily lives to aide in recovery. Orientation:   The focus of this group is to educate the patient on the purpose and policies of crisis stabilization and provide a format to answer questions about their admission.  The group details unit policies and expectations of patients while admitted.  Participation Level:  Active  Participation Quality:  Attentive  Affect:  Appropriate  Cognitive:  Alert  Insight: Good  Engagement in Group:  Engaged  Modes of Intervention:  Discussion and Education  Additional Comments:  Pt actively participated in group. Pt identified working on his discharge and connecting with provider as his goal. Pt provided support and insight to other pt   Burnett Sheng 04/07/2023, 1:15 PM

## 2023-04-07 NOTE — Progress Notes (Signed)
Patient denies SI, HI, and AVH rates depression and anxiety both 0/10. Patient presents with flat and depressed affect and may be minimizing symptoms. States he is ready to go home "you can make room for someone else in my place." Patient reports that nighttime dose of trazodone gave him stomach cramps and poor sleep. Patient is safe on the unit, Q 15 minute safety checks ongoing.  04/07/23 0800  Psych Admission Type (Psych Patients Only)  Admission Status Involuntary  Psychosocial Assessment  Patient Complaints None  Eye Contact Fair  Facial Expression Flat  Affect Depressed;Flat  Speech Logical/coherent  Interaction Assertive  Motor Activity Other (Comment) (WDL)  Appearance/Hygiene Unremarkable  Behavior Characteristics Cooperative;Appropriate to situation  Mood Depressed;Pleasant  Thought Process  Coherency WDL  Content Preoccupation  Delusions None reported or observed  Perception WDL  Hallucination None reported or observed  Judgment Impaired  Confusion None  Danger to Self  Current suicidal ideation? Denies  Agreement Not to Harm Self Yes  Description of Agreement verbal  Danger to Others  Danger to Others None reported or observed

## 2023-04-07 NOTE — Group Note (Signed)
Recreation Therapy Group Note   Group Topic:Animal Assisted Therapy   Group Date: 04/07/2023 Start Time: 0950 End Time: 1030 Facilitators: Meredith Mells-McCall, LRT,CTRS Location: [address] Dayroom   Animal-Assisted Activity (AAA) Program Checklist/Progress Notes Patient Eligibility Criteria Checklist & Daily Group note for Rec Tx Intervention  AAA/T Program Assumption of Risk Form signed by Patient/ or Parent Legal Guardian Yes  Patient is free of allergies or severe asthma Yes  Patient reports no fear of animals Yes  Patient reports no history of cruelty to animals Yes  Patient understands his/her participation is voluntary Yes  Patient washes hands before animal contact Yes  Patient washes hands after animal contact Yes   Affect/Mood: Appropriate   Participation Level: Engaged   Participation Quality: Independent   Behavior: Appropriate   Speech/Thought Process: Focused    Clinical Observations/Individualized Feedback:  Patient attended session and interacted appropriately with therapy dog and peers. Patient asked appropriate questions about therapy dog and his training. Patient shared stories about their pets at home with group.    Plan: Continue to engage patient in RT group sessions 2-3x/week.   Emanuell Morina-McCall, LRT,CTRS 04/07/2023 12:50 PM

## 2023-04-07 NOTE — BHH Group Notes (Signed)
Spiritual care group on grief and loss facilitated by chaplain Dyanne Carrel, [facility]  Group Goal:  Support / Education around grief and loss  Members engage in facilitated group support and psycho-social education.  Group Description:  Following introductions and group rules, group members engaged in facilitated group dialog and support around topic of loss, with particular support around experiences of loss in their lives. Group Identified types of loss (relationships / self / things) and identified patterns, circumstances, and changes that precipitate losses. Reflected on thoughts / feelings around loss, normalized grief responses, and recognized variety in grief experience. Group noted Worden's four tasks of grief in discussion.  Group drew on Adlerian / Rogerian, narrative, MI,  Patient Progress: Roy Jackson attended group.  His verbal comments were minimal, but he demonstrated engagement during the session.

## 2023-04-07 NOTE — Group Note (Signed)
Date:  04/07/2023 Time:  12:09 PM  Group Topic/Focus:  Managing Feelings:   The focus of this group is to identify what feelings patients have difficulty handling and develop a plan to handle them in a healthier way upon discharge.    Participation Level:  Active  Participation Quality:  Appropriate  Affect:  Appropriate  Cognitive:  Appropriate  Insight: Appropriate  Engagement in Group:  Engaged  Modes of Intervention:  Discussion, Rapport Building, Socialization, and Support  Additional Comments:    Memory Dance Roy Jackson 04/07/2023, 12:09 PM

## 2023-04-08 DIAGNOSIS — F332 Major depressive disorder, recurrent severe without psychotic features: Secondary | ICD-10-CM | POA: Diagnosis not present

## 2023-04-08 MED ORDER — VITAMIN D (ERGOCALCIFEROL) 1.25 MG (50000 UNIT) PO CAPS: 50000.0000 [IU] | ORAL_CAPSULE | ORAL | 0 refills | Status: DC

## 2023-04-08 MED ORDER — AMLODIPINE BESYLATE 5 MG PO TABS: 5.0000 mg | ORAL_TABLET | Freq: Every day | ORAL | 0 refills | Status: DC

## 2023-04-08 MED ORDER — FLUOXETINE HCL 20 MG PO CAPS: 20.0000 mg | ORAL_CAPSULE | Freq: Every day | ORAL | 0 refills | Status: DC

## 2023-04-08 NOTE — Progress Notes (Signed)
  [facility] Adult Case Management Discharge Plan :  Will you be returning to the same living situation after discharge:  Yes,  Patient will be returning back to his home  At discharge, do you have transportation home?: Yes,  Patient friend will be picking him up  Do you have the ability to pay for your medications: No. Pharmacy Savings card was provided   Release of information consent forms completed and in the chart;  Patient's signature needed at discharge.  Patient to Follow up at:  Follow-up Information     [facility]. Go on 05/15/2023.   Specialty: Behavioral Health Why: You have an appointment for medication management services on 05/15/23 at 8:00 am, in person.  You also have an appointment for therapy services on 05/26/23 at 11:00 am, in person.   *You may also go to this provider at walk in time of 7:00 am, Monday through Friday if you wish to be seen sooner. (After 2 no shows, you may only use walk-in services) Contact information: [address] [place] [address] [phone]                Next level of care provider has access to [facility] Link:yes  Safety Planning and Suicide Prevention discussed: Valentino Hue   Josephine Cables [phone]     Has patient been referred to the Quitline?: Patient refused referral for treatment  Patient has been referred for addiction treatment: No known substance use disorder.  Isabella Bowens, LCSWA 04/08/2023, 10:02 AM
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
## 2023-04-08 NOTE — Plan of Care (Signed)
  Problem: Education: Goal: Emotional status will improve Outcome: Progressing Goal: Mental status will improve Outcome: Progressing Goal: Verbalization of understanding the information provided will improve Outcome: Progressing   Problem: Activity: Goal: Interest or engagement in activities will improve Outcome: Progressing Goal: Sleeping patterns will improve Outcome: Progressing   Problem: Coping: Goal: Ability to verbalize frustrations and anger appropriately will improve Outcome: Progressing Goal: Ability to demonstrate self-control will improve Outcome: Progressing   Problem: Health Behavior/Discharge Planning: Goal: Compliance with treatment plan for underlying cause of condition will improve Outcome: Progressing   Problem: Education: Goal: Ability to make informed decisions regarding treatment will improve Outcome: Progressing   Problem: Coping: Goal: Coping ability will improve Outcome: Progressing   

## 2023-04-08 NOTE — Discharge Summary (Signed)
Physician Discharge Summary Note  Patient:  Roy Jackson is an 52 y.o., male MRN:  [id number] DOB:  1971-07-10 Patient phone:  [phone] (home)  Patient address:   [address] [place] [address],  Total Time spent with patient: 45 minutes  Date of Admission:  04/03/2023 Date of Discharge: 04/08/2023  Reason for Admission:   Roy Jackson is a 52 yo AAM with a prior history of MDD who presented to the [facility]) under IVC taken out by law enforcement after he was found in bed with a loaded gun, 7 cans of beer, an empty bottle of wine, a bottle of alcohol and sleeping pills. He told law enforcement that he drank a cup of bleach and ate roach killer as per documentation from the [facility]. Pt was found after his friend called law enforcement for a wellness check when he sent messages to friend to state say goodbye ,and that he would not be seeing him anymore.  Patient was transferred to this behavioral health Hospital for treatment and stabilization of her mental status.   Principal Problem: MDD (major depressive disorder), recurrent severe, without psychosis (HCC) Discharge Diagnoses: Principal Problem:   MDD (major depressive disorder), recurrent severe, without psychosis (HCC) Active Problems:   Vitamin D deficiency   Generalized anxiety disorder  Past Psychiatric History: See H & P  Past Medical History:  Past Medical History:  Diagnosis Date   Chronic headaches    History of hypogonadism 03/28/2019   OSA (obstructive sleep apnea) 11/12/2017   Per sleep study 10/2017    Past Surgical History:  Procedure Laterality Date   HERNIA REPAIR     TONSILLECTOMY     Family History:  Family History  Problem Relation Age of Onset   Heart attack Other        maternal GF died in 48s, maternal uncles x 2 in 20s    Hypertension Paternal Grandmother    Hypertension Paternal Grandfather    Diabetes Paternal Grandfather    Family  Psychiatric  History: See H & P Social History:  Social History   Substance and Sexual Activity  Alcohol Use Not Currently   Comment: occ     Social History   Substance and Sexual Activity  Drug Use No    Social History   Socioeconomic History   Marital status: Married    Spouse name: Not on file   Number of children: 2   Years of education: 12   Highest education level: Not on file  Occupational History   Not on file  Tobacco Use   Smoking status: Former    Types: Cigarettes   Smokeless tobacco: Never   Tobacco comments:    last one 2-3 months ago  Vaping Use   Vaping Use: Never used  Substance and Sexual Activity   Alcohol use: Not Currently    Comment: occ   Drug use: No   Sexual activity: Not Currently    Partners: Female  Other Topics Concern   Not on file  Social History Narrative   Smoking 1/2 pk per week    leave wife   2-story home   Soda 1-2 a day   11th grade   Social Determinants of Health   Financial Resource Strain: Not on file  Food Insecurity: Patient Declined (04/03/2023)   Hunger Vital Sign    Worried About Running Out of Food in the Last Year: Patient declined    Ran  Out of Food in the Last Year: Patient declined  Transportation Needs: No Transportation Needs (04/03/2023)   PRAPARE - Administrator, Civil Service (Medical): No    Lack of Transportation (Non-Medical): No  Physical Activity: Not on file  Stress: Not on file  Social Connections: Not on file   Hospital Course:   During the patient's hospitalization, patient had extensive initial psychiatric evaluation, and follow-up psychiatric evaluations every day. Psychiatric diagnoses provided upon initial assessment are as noted above.  Patient's psychiatric medications were adjusted on admission as follows: -Start Prozac 20 mg daily for depressive symptoms and anxiety -Continue trazodone 50 mg as needed nightly for sleep -Continue hydroxyzine 25 mg 3 times daily as needed  for anxiety   During the hospitalization, other adjustments were made to the patient's psychiatric medication regimen. Medications at discharge are as follows: -Continue Norvasc 5 mg daily for hypertension -Continue Prozac 20 mg daily for depressive symptoms and anxiety -Continue Vitamin D 50.000 units weekly for low vitamin D levels   Patient's care was discussed during the interdisciplinary team meeting every day during the hospitalization. The patient denies having side effects to prescribed psychiatric medication. Gradually, patient started adjusting to milieu. The patient was evaluated each day by a clinical provider to ascertain response to treatment. Improvement was noted by the patient's report of decreasing symptoms, improved sleep and appetite, affect, medication tolerance, behavior, and participation in unit programming.  Patient was asked each day to complete a self inventory noting mood, mental status, pain, new symptoms, anxiety and concerns.     Symptoms were reported as significantly decreased or resolved completely by discharge. On day of discharge, the patient reports that their mood is stable. The patient denied having suicidal thoughts for more than 48 hours prior to discharge.  Patient denies having homicidal thoughts.  Patient denies having auditory hallucinations.  Patient denies any visual hallucinations or other symptoms of psychosis. The patient was motivated to continue taking medication with a goal of continued improvement in mental health.    The patient reports their target psychiatric symptoms of depression, anxiety, insomnia responded well to the psychiatric medications, and the patient reports overall benefit from this psychiatric hospitalization. Supportive psychotherapy was provided to the patient. The patient also participated in regular group therapy while hospitalized. Coping skills, problem solving as well as relaxation therapies were also part of the unit  programming.   Labs were reviewed with the patient, and abnormal results were discussed with the patient.   The patient is able to verbalize their individual safety plan to this provider.   # It is recommended to the patient to continue psychiatric medications as prescribed, after discharge from the hospital.     # It is recommended to the patient to follow up with your outpatient psychiatric provider and PCP.   # It was discussed with the patient, the impact of alcohol, drugs, tobacco have been there overall psychiatric and medical wellbeing, and total abstinence from substance use was recommended the patient.ed.   # Prescriptions provided or sent directly to preferred pharmacy at discharge. Patient agreeable to plan. Given opportunity to ask questions. Appears to feel comfortable with discharge.    # In the event of worsening symptoms, the patient is instructed to call the crisis hotline (988), 911 and or go to the nearest ED for appropriate evaluation and treatment of symptoms. To follow-up with primary care provider for other medical issues, concerns and or health care needs   # Patient  was discharged home with a plan to follow up as noted below.  Total Time spent with patient: 45 minutes Physical Findings: AIMS: 0 CIWA:  n/a COWS:  n/a  Musculoskeletal: Strength & Muscle Tone: within normal limits Gait & Station: normal Patient leans: N/A  Psychiatric Specialty Exam:  Presentation  General Appearance:  Appropriate for Environment  Eye Contact: Good  Speech: Clear and Coherent  Speech Volume: Normal  Handedness: Right   Mood and Affect  Mood: Euthymic  Affect: Congruent   Thought Process  Thought Processes: Coherent  Descriptions of Associations:Intact  Orientation:Full (Time, Place and Person)  Thought Content:WDL  History of Schizophrenia/Schizoaffective disorder:No data recorded Duration of Psychotic Symptoms:No data  recorded Hallucinations:Hallucinations: None  Ideas of Reference:None  Suicidal Thoughts:Suicidal Thoughts: No  Homicidal Thoughts:Homicidal Thoughts: No   Sensorium  Memory: Immediate Good  Judgment: Fair  Insight: Fair   Art therapist  Concentration: Fair  Attention Span: Good  Recall: Good  Fund of Knowledge: Good  Language: Good  Psychomotor Activity  Psychomotor Activity: Psychomotor Activity: Normal  Assets  Assets: Communication Skills   Sleep  Sleep: Sleep: Good  Physical Exam: Physical Exam Constitutional:      Appearance: Normal appearance.  Musculoskeletal:        General: Normal range of motion.     Cervical back: Normal range of motion.  Neurological:     Mental Status: He is oriented to person, place, and time.    Review of Systems  Constitutional:  Negative for fever.  HENT:  Negative for hearing loss.   Eyes:  Negative for blurred vision.  Respiratory:  Negative for cough.   Cardiovascular:  Negative for chest pain.  Gastrointestinal:  Negative for heartburn.  Genitourinary:  Negative for dysuria.  Skin:  Negative for rash.  Neurological:  Negative for dizziness.  Psychiatric/Behavioral:  Positive for depression (Denies SI/HI/AVH, denies intent or plan to harm self or any one else outside of [place]) and substance abuse (Educated on the negative impact of substance use on his mental health). Negative for hallucinations, memory loss and suicidal ideas. The patient is nervous/anxious (Resolving) and has insomnia (Resolving).    Blood pressure (!) 141/88, pulse 69, temperature 98 F (36.7 C), temperature source Oral, resp. rate 20, height 5\' 8"  (1.727 m), weight 108.9 kg, SpO2 99 %. Body mass index is 36.49 kg/m.   Social History   Tobacco Use  Smoking Status Former   Types: Cigarettes  Smokeless Tobacco Never  Tobacco Comments   last one 2-3 months ago   Tobacco Cessation:  A prescription for an  FDA-approved tobacco cessation medication was offered at discharge and the patient refused   Blood Alcohol level:  Lab Results  Component Value Date   ETH <10 04/02/2023    Metabolic Disorder Labs:  Lab Results  Component Value Date   HGBA1C 5.7 (H) 04/04/2023   MPG 117 04/04/2023   MPG 111 06/09/2014   Lab Results  Component Value Date   PROLACTIN 8.9 05/03/2019   Lab Results  Component Value Date   CHOL 223 (H) 04/04/2023   TRIG 85 04/04/2023   HDL 43 04/04/2023   CHOLHDL 5.2 04/04/2023   VLDL 17 04/04/2023   LDLCALC 163 (H) 04/04/2023   LDLCALC 105 (H) 06/10/2014    See Psychiatric Specialty Exam and Suicide Risk Assessment completed by Attending Physician prior to discharge.  Discharge destination:  Home  Is patient on multiple antipsychotic therapies at discharge:  No   Has Patient  had three or more failed trials of antipsychotic monotherapy by history:  No  Recommended Plan for Multiple Antipsychotic Therapies: NA   Allergies as of 04/08/2023       Reactions   Darvocet [propoxyphene N-acetaminophen] Other (See Comments)   unknown   Penicillins Swelling, Other (See Comments)   Pt states he has had amoxicillin several times without issues, and has bicillin without any problems. Pt states his PCP said he "probably grew out of it."   Sulfa Antibiotics Other (See Comments)   unkown        Medication List     STOP taking these medications    MULTIVITAMIN GUMMIES ADULT PO   OVER THE COUNTER MEDICATION       TAKE these medications      Indication  amLODipine 5 MG tablet Commonly known as: NORVASC Take 1 tablet (5 mg total) by mouth daily. Start taking on: April 09, 2023  Indication: High Blood Pressure Disorder   FLUoxetine 20 MG capsule Commonly known as: PROZAC Take 1 capsule (20 mg total) by mouth daily. Start taking on: April 09, 2023  Indication: Major Depressive Disorder   Vitamin D (Ergocalciferol) 1.25 MG (50000 UNIT) Caps  capsule Commonly known as: DRISDOL Take 1 capsule (50,000 Units total) by mouth every 7 (seven) days. Start taking on: April 11, 2023  Indication: Vitamin D Deficiency        Follow-up Information     [facility]. Go on 05/15/2023.   Specialty: Behavioral Health Why: You have an appointment for medication management services on 05/15/23 at 8:00 am, in person.  You also have an appointment for therapy services on 05/26/23 at 11:00 am, in person.   *You may also go to this provider at walk in time of 7:00 am, Monday through Friday if you wish to be seen sooner. (After 2 no shows, you may only use walk-in services) Contact information: [address] [place] [address] [phone]               Signed: Starleen Blue, NP 04/08/2023, 1:02 PM

## 2023-04-08 NOTE — Progress Notes (Signed)
Patient verbalizes readiness for discharge. All patient belongings returned to patient. Discharge instructions read and discussed with patient (appointments, medications, resources). Patient expressed gratitude for care provided. Patient discharged to lobby at 36 where friend was waiting.

## 2023-04-08 NOTE — Progress Notes (Signed)
   04/07/23 1950  Psych Admission Type (Psych Patients Only)  Admission Status Involuntary  Psychosocial Assessment  Patient Complaints None  Eye Contact Fair  Facial Expression Flat  Affect Depressed  Speech Logical/coherent  Interaction Assertive  Motor Activity Other (Comment)  Appearance/Hygiene Unremarkable  Behavior Characteristics Cooperative;Appropriate to situation  Mood Depressed;Pleasant  Thought Process  Coherency WDL  Content Preoccupation  Delusions None reported or observed  Perception WDL  Hallucination None reported or observed  Judgment Poor  Confusion None  Danger to Self  Current suicidal ideation? Denies  Agreement Not to Harm Self Yes  Description of Agreement Verbally contracted for safety.  Danger to Others  Danger to Others None reported or observed

## 2023-05-15 ENCOUNTER — Ambulatory Visit (HOSPITAL_COMMUNITY): Payer: No Typology Code available for payment source | Admitting: Student

## 2023-05-26 ENCOUNTER — Ambulatory Visit (HOSPITAL_COMMUNITY): Payer: No Typology Code available for payment source | Admitting: Licensed Clinical Social Worker

## 2023-10-21 ENCOUNTER — Encounter: Payer: Self-pay | Admitting: Intensive Care

## 2023-10-21 ENCOUNTER — Other Ambulatory Visit: Payer: Self-pay

## 2023-10-21 ENCOUNTER — Emergency Department
Admission: EM | Admit: 2023-10-21 | Discharge: 2023-10-21 | Discharge status: Against Medical Advice | Payer: Self-pay | Attending: Emergency Medicine | Admitting: Emergency Medicine

## 2023-10-21 ENCOUNTER — Emergency Department: Payer: Self-pay

## 2023-10-21 DIAGNOSIS — Z5321 Procedure and treatment not carried out due to patient leaving prior to being seen by health care provider: Secondary | ICD-10-CM | POA: Insufficient documentation

## 2023-10-21 DIAGNOSIS — J029 Acute pharyngitis, unspecified: Secondary | ICD-10-CM | POA: Insufficient documentation

## 2023-10-21 DIAGNOSIS — Z20822 Contact with and (suspected) exposure to covid-19: Secondary | ICD-10-CM | POA: Insufficient documentation

## 2023-10-21 DIAGNOSIS — R519 Headache, unspecified: Secondary | ICD-10-CM | POA: Insufficient documentation

## 2023-10-21 DIAGNOSIS — R079 Chest pain, unspecified: Secondary | ICD-10-CM | POA: Insufficient documentation

## 2023-10-21 DIAGNOSIS — R110 Nausea: Secondary | ICD-10-CM | POA: Insufficient documentation

## 2023-10-21 HISTORY — DX: Dizziness and giddiness: R42

## 2023-10-21 HISTORY — DX: Essential (primary) hypertension: I10

## 2023-10-21 LAB — RESP PANEL BY RT-PCR (RSV, FLU A&B, COVID)  RVPGX2
Influenza A by PCR: NEGATIVE
Influenza B by PCR: NEGATIVE
Resp Syncytial Virus by PCR: NEGATIVE
SARS Coronavirus 2 by RT PCR: NEGATIVE

## 2023-10-21 LAB — BASIC METABOLIC PANEL
Anion gap: 11 (ref 5–15)
BUN: 9 mg/dL (ref 6–20)
CO2: 24 mmol/L (ref 22–32)
Calcium: 9 mg/dL (ref 8.9–10.3)
Chloride: 103 mmol/L (ref 98–111)
Creatinine, Ser: 1.1 mg/dL (ref 0.61–1.24)
GFR, Estimated: >60 mL/min (ref >60)
Glucose, Bld: 91 mg/dL (ref 70–99)
Potassium: 3.6 mmol/L (ref 3.5–5.1)
Sodium: 138 mmol/L (ref 135–145)

## 2023-10-21 LAB — CBC
HCT: 47.2 % (ref 39.0–52.0)
Hemoglobin: 15.6 g/dL (ref 13.0–17.0)
MCH: 30.8 pg (ref 26.0–34.0)
MCHC: 33.1 g/dL (ref 30.0–36.0)
MCV: 93.1 fL (ref 80.0–100.0)
Platelets: 228 10*3/uL (ref 150–400)
RBC: 5.07 MIL/uL (ref 4.22–5.81)
RDW: 12.8 % (ref 11.5–15.5)
WBC: 7.6 10*3/uL (ref 4.0–10.5)
nRBC: 0 % (ref 0.0–0.2)

## 2023-10-21 LAB — TROPONIN I (HIGH SENSITIVITY): Troponin I (High Sensitivity): 5 ng/L (ref <18)

## 2023-10-21 NOTE — ED Triage Notes (Signed)
 Patient c/o chest pain that started yesterday and has worsened today. Describes as grabbing and squeezing. Reports headache, nausea, and sore throat.

## 2023-10-21 NOTE — ED Notes (Signed)
 No answer when called several times from lobby

## 2023-12-21 ENCOUNTER — Emergency Department (HOSPITAL_COMMUNITY)
Admission: EM | Admit: 2023-12-21 | Discharge: 2023-12-21 | Disposition: A | Discharge status: Home / Self Care | Payer: Self-pay | Attending: Emergency Medicine | Admitting: Emergency Medicine

## 2023-12-21 ENCOUNTER — Encounter (HOSPITAL_COMMUNITY): Payer: Self-pay

## 2023-12-21 DIAGNOSIS — Z202 Contact with and (suspected) exposure to infections with a predominantly sexual mode of transmission: Secondary | ICD-10-CM | POA: Insufficient documentation

## 2023-12-21 MED ORDER — METRONIDAZOLE 500 MG PO TABS: 500.0000 mg | ORAL_TABLET | Freq: Two times a day (BID) | ORAL | 0 refills | Status: AC

## 2023-12-21 NOTE — ED Triage Notes (Addendum)
 Pt presents for STD check.  Sts he found out his wife has been cheating w/ someone who has been treated for trichomonas.  Pt has no symptoms.

## 2023-12-21 NOTE — ED Provider Notes (Signed)
 [facility] AT [facility] Provider Note   CSN: [id number] Arrival date & time: 12/21/23  1728     History  Chief Complaint  Patient presents with   Exposure to STD    RICHAR DUNKLEE is a 53 y.o. male with overall noncontributory past medical history presents with concern for STD check.  He reports he found out his wife has been cheating on him with someone who was recently treated for trichomonas.  He denies any symptoms at this time.  Reports that he had clean STI screen around 1 month ago.   Exposure to STD       Home Medications Prior to Admission medications   Medication Sig Start Date End Date Taking? Authorizing Provider  ascorbic acid (VITAMIN C) 500 MG tablet Take 500 mg by mouth daily.   Yes [provider]  cholecalciferol (VITAMIN D3) 25 MCG (1000 UNIT) tablet Take 1,000 Units by mouth daily.   Yes [provider]  metroNIDAZOLE (FLAGYL) 500 MG tablet Take 1 tablet (500 mg total) by mouth 2 (two) times daily. 12/21/23  Yes Danisa Kopec H, PA-C  Multiple Vitamin (MULTIVITAMIN WITH MINERALS) TABS tablet Take 1 tablet by mouth daily.   Yes [provider]      Allergies    Darvocet [propoxyphene n-acetaminophen], Penicillins, and Sulfa antibiotics    Review of Systems   Review of Systems  All other systems reviewed and are negative.   Physical Exam Updated Vital Signs BP (!) 181/95 (BP Location: Right Arm)   Pulse 81   Temp 98.2 F (36.8 C) (Oral)   Resp 16   Ht 5\' 9"  (1.753 m)   Wt 107.5 kg   SpO2 98%   BMI 35.00 kg/m  Physical Exam Vitals and nursing note reviewed.  Constitutional:      General: He is not in acute distress.    Appearance: Normal appearance.  HENT:     Head: Normocephalic and atraumatic.  Eyes:     General:        Right eye: No discharge.        Left eye: No discharge.  Cardiovascular:     Rate and Rhythm: Normal rate and regular rhythm.  Pulmonary:      Effort: Pulmonary effort is normal. No respiratory distress.  Genitourinary:    Comments: Declines genital exam Musculoskeletal:        General: No deformity.  Skin:    General: Skin is warm and dry.  Neurological:     Mental Status: He is alert and oriented to person, place, and time.  Psychiatric:        Mood and Affect: Mood normal.        Behavior: Behavior normal.     ED Results / Procedures / Treatments   Labs (all labs ordered are listed, but only abnormal results are displayed) Labs Reviewed - No data to display  EKG None  Radiology No results found.  Procedures Procedures    Medications Ordered in ED Medications - No data to display  ED Course/ Medical Decision Making/ A&P                                 Medical Decision Making Risk Prescription drug management.   This patient is a 53 y.o. male who presents to the ED for concern of STI.   Differential diagnoses prior to evaluation: STI, versus other  Past Medical History / Social History / Additional history: Chart reviewed. Pertinent results include: Overall noncontributory  Physical Exam: Physical exam performed. The pertinent findings include: Stable vital signs in the ED other than hypertension, blood pressure 181/95.  He declines genital exam reporting no symptoms at this time.  Medications / Treatment: Flagyl for trichomonas exposure   Disposition: After consideration of the diagnostic results and the patients response to treatment, I feel that patient with exposure to trichomonas, will plan to treat, discussed that ultimately would recommend blood work and testing to make sure that he does not have any other concurrent STI but he declines at this time.Mar[facility] Kitchen   emergency department workup does not suggest an emergent condition requiring admission or immediate intervention beyond what has been performed at this time. The plan is: as above. The patient is safe for discharge and has been instructed to  return immediately for worsening symptoms, change in symptoms or any other concerns.  Final Clinical Impression(s) / ED Diagnoses Final diagnoses:  STD exposure    Rx / DC Orders ED Discharge Orders          Ordered    metroNIDAZOLE (FLAGYL) 500 MG tablet  2 times daily        12/21/23 1806              Maty Zeisler, [place] H, PA-C 12/21/23 1821    Sloan Leiter, DO 12/22/23 0041

## 2023-12-21 NOTE — Discharge Instructions (Signed)
 As we discussed you declined any STI testing today but we will treat you for presumed trichomonas since your partner was tested positive for that.  I recommend that you follow-up for additional STI testing especially if you are having any symptoms.  Please take the entire course of antibiotics, please do not drink while you are taking the antibiotics as it will give you a reaction like you are having alcohol withdrawals.

## 2023-12-21 NOTE — ED Notes (Signed)
 Pt verbalized understanding of discharge instructions. Opportunity for questions provided.

## 2023-12-22 ENCOUNTER — Ambulatory Visit: Payer: Self-pay

## 2024-05-12 ENCOUNTER — Telehealth: Payer: Self-pay | Admitting: Licensed Clinical Social Worker

## 2024-05-12 NOTE — Telephone Encounter (Signed)
 Patient lvm for LCSW reporting interest in therapy for depression.

## 2024-05-25 ENCOUNTER — Ambulatory Visit: Payer: Self-pay | Admitting: Licensed Clinical Social Worker
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
## 2024-06-01 ENCOUNTER — Ambulatory Visit: Payer: Self-pay | Admitting: Licensed Clinical Social Worker

## 2024-06-01 DIAGNOSIS — F331 Major depressive disorder, recurrent, moderate: Secondary | ICD-10-CM
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
## 2024-06-02 DIAGNOSIS — F331 Major depressive disorder, recurrent, moderate: Secondary | ICD-10-CM | POA: Insufficient documentation

## 2024-06-14 ENCOUNTER — Ambulatory Visit: Payer: Self-pay | Admitting: Licensed Clinical Social Worker

## 2024-06-14 DIAGNOSIS — F331 Major depressive disorder, recurrent, moderate: Secondary | ICD-10-CM

## 2024-06-14 NOTE — Progress Notes (Unsigned)
 Counselor/Therapist Progress Note  Patient ID: Roy Jackson, MRN: [id number],    Date: 06/14/2024  Time Spent: ## total minutes. I spent ## minutes on real-time audio and video with the patient on the date of service. I spent an additional ##  minutes on pre- and post-visit activities on the date of service including collateral, chart review, team discussion, and documentation.   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 The LCSW informed the patient that it would not be appropriate to begin the session until he was off work. The patient agreed to join the session in 15 minutes, once he was finished with work.  Treatment Type: Individual Therapy  Reported Symptoms: {CHL AMB Reported [phone]}  Mental Status Exam:  Appearance:   {PSY:22683}     Behavior:  {PSY:21022743}  Motor:  {PSY:22302}  Speech/Language:   {PSY:22685}  Affect:  {PSY:22687}  Mood:  {PSY:31886}  Thought process:  {PSY:31888}  Thought content:    {PSY:[id number]}  Sensory/Perceptual disturbances:    {[phone]}  Orientation:  {PSY:30297}  Attention:  {PSY:22877}  Concentration:  {PSY:765 096 8533}  Memory:  {PSY:2400438003}  Fund of knowledge:   {PSY:765 096 8533}  Insight:    {PSY:765 096 8533}  Judgment:   {PSY:765 096 8533}  Impulse Control:  {PSY:765 096 8533}   Risk Assessment: Danger to Self:  No Self-injurious Behavior: No Danger to Others: No Duty to Warn:no Physical Aggression / Violence:No  Access to Firearms a concern: No  Gang Involvement:No   Subjective: Patient reports  back and forth, some days good days and some days doesn't want to get out of bed and be around people. Denies any suicidal thoughts denies any since last session.   Interventions: Cognitive Behavioral Therapy and Client Centered  LCSW established psychological safety.  Checked in with the patient and conducted a brief assessment of current symptoms, psychosocial  stressors, and safety. Collaboratively set the session agenda. Reviewed content from the previous session, including assessment findings. Introduced and reviewed the therapeutic modalities being utilized, including an integrated CBT approach (drawing from CBT, ACT, DBT, and Mindfulness) as well as Somatic Processing techniques. Reviewed core concepts of Cognitive Behavioral Therapy and discussed how these approaches will support the patient's goals. Explored the patient's treatment goals, continued to build rapport, and worked collaboratively to begin developing a treatment plan tailored to their needs. Provided therapeutic support through active listening, validation of emotional experiences, and by highlighting the patient's strengths.   Diagnosis:   ICD-10-CM   1. Major depressive disorder, recurrent episode, moderate (HCC)  F33.1      Plan: Patient's goal of treatment: wants to learn different methods of handling his mood and changing his mood.     No future appointments.    Alan Hail, LCSW

## 2024-06-27 ENCOUNTER — Ambulatory Visit: Payer: Self-pay | Admitting: Licensed Clinical Social Worker
